# Patient Record
Sex: Male | Born: 1955
Health system: Southern US, Community
[De-identification: ages and names within clinical notes are randomized; demographics above are authoritative.]

## PROBLEM LIST (undated history)

## (undated) DIAGNOSIS — N200 Calculus of kidney: Secondary | ICD-10-CM

## (undated) DIAGNOSIS — K861 Other chronic pancreatitis: Secondary | ICD-10-CM

## (undated) DIAGNOSIS — E785 Hyperlipidemia, unspecified: Secondary | ICD-10-CM

## (undated) DIAGNOSIS — Z87442 Personal history of urinary calculi: Secondary | ICD-10-CM

## (undated) DIAGNOSIS — K219 Gastro-esophageal reflux disease without esophagitis: Secondary | ICD-10-CM

## (undated) DIAGNOSIS — F341 Dysthymic disorder: Secondary | ICD-10-CM

## (undated) DIAGNOSIS — I10 Essential (primary) hypertension: Secondary | ICD-10-CM

## (undated) HISTORY — DX: Calculus of kidney: N20.0

## (undated) HISTORY — DX: Gastro-esophageal reflux disease without esophagitis: K21.9

## (undated) HISTORY — PX: UPPER GASTROINTESTINAL ENDOSCOPY: SHX188

## (undated) HISTORY — DX: Essential (primary) hypertension: I10

## (undated) HISTORY — DX: Hyperlipidemia, unspecified: E78.5

## (undated) HISTORY — DX: Dysthymic disorder: F34.1

---

## 2001-04-04 ENCOUNTER — Encounter: Payer: Self-pay | Admitting: Family Medicine

## 2001-04-04 ENCOUNTER — Encounter: Admission: RE | Admit: 2001-04-04 | Discharge: 2001-04-04 | Payer: Self-pay | Admitting: Family Medicine

## 2001-04-17 ENCOUNTER — Encounter: Payer: Self-pay | Admitting: Family Medicine

## 2001-04-17 ENCOUNTER — Encounter: Admission: RE | Admit: 2001-04-17 | Discharge: 2001-04-17 | Payer: Self-pay | Admitting: Family Medicine

## 2001-04-17 HISTORY — PX: OTHER SURGICAL HISTORY: SHX169

## 2001-04-18 ENCOUNTER — Encounter: Payer: Self-pay | Admitting: Surgery

## 2001-04-18 ENCOUNTER — Encounter (INDEPENDENT_AMBULATORY_CARE_PROVIDER_SITE_OTHER): Payer: Self-pay | Admitting: Specialist

## 2001-04-18 ENCOUNTER — Observation Stay (HOSPITAL_COMMUNITY): Admission: RE | Admit: 2001-04-18 | Discharge: 2001-04-19 | Payer: Self-pay | Admitting: Surgery

## 2001-04-28 HISTORY — PX: CHOLECYSTECTOMY: SHX55

## 2002-03-13 ENCOUNTER — Encounter: Payer: Self-pay | Admitting: Family Medicine

## 2002-03-13 LAB — CONVERTED CEMR LAB: PSA: 0.2 ng/mL

## 2004-10-19 ENCOUNTER — Ambulatory Visit: Payer: Self-pay | Admitting: Family Medicine

## 2004-10-21 ENCOUNTER — Ambulatory Visit: Payer: Self-pay | Admitting: Family Medicine

## 2004-12-02 ENCOUNTER — Ambulatory Visit: Payer: Self-pay | Admitting: Family Medicine

## 2005-01-03 ENCOUNTER — Ambulatory Visit: Payer: Self-pay | Admitting: Family Medicine

## 2005-01-19 ENCOUNTER — Ambulatory Visit: Payer: Self-pay | Admitting: Family Medicine

## 2005-01-21 ENCOUNTER — Ambulatory Visit: Payer: Self-pay | Admitting: Family Medicine

## 2005-08-01 ENCOUNTER — Ambulatory Visit: Payer: Self-pay | Admitting: Family Medicine

## 2006-02-27 ENCOUNTER — Ambulatory Visit: Payer: Self-pay | Admitting: Family Medicine

## 2006-02-27 LAB — CONVERTED CEMR LAB: PSA: 0.17 ng/mL

## 2006-03-01 ENCOUNTER — Ambulatory Visit: Payer: Self-pay | Admitting: Family Medicine

## 2006-04-13 ENCOUNTER — Ambulatory Visit: Payer: Self-pay | Admitting: Family Medicine

## 2007-03-07 ENCOUNTER — Encounter: Payer: Self-pay | Admitting: Family Medicine

## 2007-03-07 DIAGNOSIS — K219 Gastro-esophageal reflux disease without esophagitis: Secondary | ICD-10-CM

## 2007-03-07 DIAGNOSIS — F341 Dysthymic disorder: Secondary | ICD-10-CM

## 2007-03-12 ENCOUNTER — Telehealth (INDEPENDENT_AMBULATORY_CARE_PROVIDER_SITE_OTHER): Payer: Self-pay | Admitting: *Deleted

## 2007-04-09 ENCOUNTER — Ambulatory Visit: Payer: Self-pay | Admitting: Family Medicine

## 2007-04-10 ENCOUNTER — Encounter: Payer: Self-pay | Admitting: Family Medicine

## 2007-04-11 ENCOUNTER — Ambulatory Visit: Payer: Self-pay | Admitting: Family Medicine

## 2007-04-11 LAB — CONVERTED CEMR LAB
Bilirubin Urine: NEGATIVE
Blood in Urine, dipstick: NEGATIVE
Glucose, Urine, Semiquant: NEGATIVE
Ketones, urine, test strip: NEGATIVE
Nitrite: NEGATIVE
Specific Gravity, Urine: 1.02
Urobilinogen, UA: 0.2
WBC Urine, dipstick: NEGATIVE
pH: 6

## 2007-04-25 ENCOUNTER — Ambulatory Visit: Payer: Self-pay | Admitting: Family Medicine

## 2007-05-01 ENCOUNTER — Encounter (INDEPENDENT_AMBULATORY_CARE_PROVIDER_SITE_OTHER): Payer: Self-pay | Admitting: *Deleted

## 2007-05-01 ENCOUNTER — Ambulatory Visit: Payer: Self-pay | Admitting: Family Medicine

## 2008-05-15 ENCOUNTER — Ambulatory Visit: Payer: Self-pay | Admitting: Family Medicine

## 2008-05-15 LAB — CONVERTED CEMR LAB
ALT: 26 units/L (ref 0–53)
AST: 21 units/L (ref 0–37)
Albumin: 3.9 g/dL (ref 3.5–5.2)
Alkaline Phosphatase: 65 units/L (ref 39–117)
BUN: 15 mg/dL (ref 6–23)
Basophils Absolute: 0 10*3/uL (ref 0.0–0.1)
Basophils Relative: 0.5 % (ref 0.0–3.0)
Bilirubin, Direct: 0.1 mg/dL (ref 0.0–0.3)
CO2: 30 meq/L (ref 19–32)
Calcium: 9.1 mg/dL (ref 8.4–10.5)
Chloride: 107 meq/L (ref 96–112)
Cholesterol: 167 mg/dL (ref 0–200)
Creatinine, Ser: 1 mg/dL (ref 0.4–1.5)
Direct LDL: 90.8 mg/dL
Eosinophils Absolute: 0.2 10*3/uL (ref 0.0–0.7)
Eosinophils Relative: 2.9 % (ref 0.0–5.0)
GFR calc Af Amer: 101 mL/min
GFR calc non Af Amer: 83 mL/min
Glucose, Bld: 79 mg/dL (ref 70–99)
HCT: 42.6 % (ref 39.0–52.0)
HDL: 36.1 mg/dL — ABNORMAL LOW (ref >39.0)
Hemoglobin: 14.8 g/dL (ref 13.0–17.0)
Lymphocytes Relative: 26.1 % (ref 12.0–46.0)
MCHC: 34.8 g/dL (ref 30.0–36.0)
MCV: 91.7 fL (ref 78.0–100.0)
Monocytes Absolute: 0.4 10*3/uL (ref 0.1–1.0)
Monocytes Relative: 6.8 % (ref 3.0–12.0)
Neutro Abs: 3.8 10*3/uL (ref 1.4–7.7)
Neutrophils Relative %: 63.7 % (ref 43.0–77.0)
PSA: 0.18 ng/mL (ref 0.10–4.00)
Platelets: 217 10*3/uL (ref 150–400)
Potassium: 4.3 meq/L (ref 3.5–5.1)
RBC: 4.64 M/uL (ref 4.22–5.81)
RDW: 12.3 % (ref 11.5–14.6)
Sodium: 141 meq/L (ref 135–145)
TSH: 2.03 microintl units/mL (ref 0.35–5.50)
Total Bilirubin: 0.8 mg/dL (ref 0.3–1.2)
Total CHOL/HDL Ratio: 4.6
Total Protein: 6.8 g/dL (ref 6.0–8.3)
Triglycerides: 276 mg/dL (ref 0–149)
VLDL: 55 mg/dL — ABNORMAL HIGH (ref 0–40)
WBC: 5.9 10*3/uL (ref 4.5–10.5)

## 2008-05-19 ENCOUNTER — Ambulatory Visit: Payer: Self-pay | Admitting: Family Medicine

## 2008-06-05 ENCOUNTER — Ambulatory Visit: Payer: Self-pay | Admitting: Family Medicine

## 2008-06-05 ENCOUNTER — Encounter (INDEPENDENT_AMBULATORY_CARE_PROVIDER_SITE_OTHER): Payer: Self-pay | Admitting: *Deleted

## 2008-06-05 LAB — CONVERTED CEMR LAB
OCCULT 1: NEGATIVE
OCCULT 2: NEGATIVE
OCCULT 3: NEGATIVE

## 2009-04-23 ENCOUNTER — Telehealth: Payer: Self-pay | Admitting: Family Medicine

## 2009-05-18 ENCOUNTER — Ambulatory Visit: Payer: Self-pay | Admitting: Family Medicine

## 2009-05-18 LAB — CONVERTED CEMR LAB
ALT: 27 units/L (ref 0–53)
AST: 24 units/L (ref 0–37)
Albumin: 4.1 g/dL (ref 3.5–5.2)
Alkaline Phosphatase: 72 units/L (ref 39–117)
BUN: 11 mg/dL (ref 6–23)
Bilirubin, Direct: 0 mg/dL (ref 0.0–0.3)
CO2: 30 meq/L (ref 19–32)
Calcium: 9.3 mg/dL (ref 8.4–10.5)
Chloride: 104 meq/L (ref 96–112)
Cholesterol: 175 mg/dL (ref 0–200)
Creatinine, Ser: 1.1 mg/dL (ref 0.4–1.5)
GFR calc non Af Amer: 74.27 mL/min (ref >60)
Glucose, Bld: 96 mg/dL (ref 70–99)
HDL: 39.8 mg/dL (ref >39.00)
LDL Cholesterol: 104 mg/dL — ABNORMAL HIGH (ref 0–99)
PSA: 0.2 ng/mL (ref 0.10–4.00)
Potassium: 4.2 meq/L (ref 3.5–5.1)
Sodium: 140 meq/L (ref 135–145)
TSH: 1.46 microintl units/mL (ref 0.35–5.50)
Total Bilirubin: 0.8 mg/dL (ref 0.3–1.2)
Total CHOL/HDL Ratio: 4
Total Protein: 7.1 g/dL (ref 6.0–8.3)
Triglycerides: 155 mg/dL — ABNORMAL HIGH (ref 0.0–149.0)
VLDL: 31 mg/dL (ref 0.0–40.0)

## 2009-05-20 ENCOUNTER — Ambulatory Visit: Payer: Self-pay | Admitting: Family Medicine

## 2009-06-24 ENCOUNTER — Ambulatory Visit: Payer: Self-pay | Admitting: Family Medicine

## 2009-06-24 LAB — CONVERTED CEMR LAB
OCCULT 1: NEGATIVE
OCCULT 2: NEGATIVE
OCCULT 3: NEGATIVE

## 2009-06-29 ENCOUNTER — Encounter (INDEPENDENT_AMBULATORY_CARE_PROVIDER_SITE_OTHER): Payer: Self-pay | Admitting: *Deleted

## 2010-01-14 ENCOUNTER — Encounter (INDEPENDENT_AMBULATORY_CARE_PROVIDER_SITE_OTHER): Payer: Self-pay | Admitting: *Deleted

## 2010-05-18 ENCOUNTER — Telehealth: Payer: Self-pay | Admitting: Family Medicine

## 2010-07-12 ENCOUNTER — Other Ambulatory Visit: Payer: Self-pay | Admitting: Family Medicine

## 2010-07-12 ENCOUNTER — Ambulatory Visit
Admission: RE | Admit: 2010-07-12 | Discharge: 2010-07-12 | Payer: Self-pay | Source: Home / Self Care | Attending: Family Medicine | Admitting: Family Medicine

## 2010-07-12 LAB — BASIC METABOLIC PANEL
BUN: 19 mg/dL (ref 6–23)
Calcium: 9 mg/dL (ref 8.4–10.5)
Chloride: 103 mEq/L (ref 96–112)
Creatinine, Ser: 1.1 mg/dL (ref 0.4–1.5)
GFR: 76.35 mL/min (ref >60.00)

## 2010-07-12 LAB — HEPATIC FUNCTION PANEL
ALT: 27 U/L (ref 0–53)
Bilirubin, Direct: 0.1 mg/dL (ref 0.0–0.3)
Total Bilirubin: 0.6 mg/dL (ref 0.3–1.2)

## 2010-07-12 LAB — CBC WITH DIFFERENTIAL/PLATELET
Eosinophils Relative: 4 % (ref 0.0–5.0)
MCV: 92.4 fl (ref 78.0–100.0)
Monocytes Absolute: 0.4 10*3/uL (ref 0.1–1.0)
Neutrophils Relative %: 63.5 % (ref 43.0–77.0)
Platelets: 203 10*3/uL (ref 150.0–400.0)
WBC: 6.4 10*3/uL (ref 4.5–10.5)

## 2010-07-12 LAB — LIPID PANEL
Cholesterol: 171 mg/dL (ref 0–200)
LDL Cholesterol: 97 mg/dL (ref 0–99)
Triglycerides: 160 mg/dL — ABNORMAL HIGH (ref 0.0–149.0)
VLDL: 32 mg/dL (ref 0.0–40.0)

## 2010-07-13 NOTE — Progress Notes (Signed)
Summary: needs written scripts for mail order  Phone Note Refill Request Call back at [phone] Message from:  Patient  Refills Requested: Medication #1:  SERTRALINE HCL 100 MG TABS one tab by mouth once daily  Medication #2:  PRILOSEC 20 MG  CPDR 1 by mouth daily Pt needs written 90 day scripts for mail order, please call when ready.  Initial call taken by: Lowella Petties CMA, AAMA,  May 18, 2010 10:25 AM  Follow-up for Phone Call        Advised pt that scripts are ready. Follow-up by: Lowella Petties CMA, AAMA,  May 19, 2010 8:44 AM    Prescriptions: SIMVASTATIN 40 MG  TABS (SIMVASTATIN) 1 daily  #90 x 3   Entered and Authorized by:   Shaune Leeks MD   Signed by:   Shaune Leeks MD on 05/19/2010   Method used:   Print then Give to Patient   RxID:   [id number] PRILOSEC 20 MG  CPDR (OMEPRAZOLE) 1 by mouth daily  #90 x 3   Entered and Authorized by:   Shaune Leeks MD   Signed by:   Shaune Leeks MD on 05/19/2010   Method used:   Print then Give to Patient   RxID:   [id number] SERTRALINE HCL 100 MG TABS (SERTRALINE HCL) one tab by mouth once daily  #90 x 3   Entered and Authorized by:   Shaune Leeks MD   Signed by:   Shaune Leeks MD on 05/19/2010   Method used:   Print then Give to Patient   RxID:   [id number]

## 2010-07-13 NOTE — Letter (Signed)
Summary: Results Follow up Letter  [place] at [facility]  [address] [place], [place] [address]   Phone: [phone]  Fax: [phone]    06/29/2009 MRN: [id number]  BRISTOL SOY [address] [place], [place]  [address]  Dear Mr. REEDE,  The following are the results of your recent test(s):  Test         Result    Pap Smear:        Normal _____  Not Normal _____ Comments: ______________________________________________________ Cholesterol: LDL(Bad cholesterol):         Your goal is less than:         HDL (Good cholesterol):       Your goal is more than: Comments:  ______________________________________________________ Mammogram:        Normal _____  Not Normal _____ Comments:  ___________________________________________________________________ Hemoccult:        Normal ___X__  Not normal _______ Comments:  Please repeat in one year.  _____________________________________________________________________ Other Tests:    We routinely do not discuss normal results over the telephone.  If you desire a copy of the results, or you have any questions about this information we can discuss them at your next office visit.   Sincerely,     Laurita Quint, MD

## 2010-07-13 NOTE — Letter (Signed)
Summary: Nadara Eaton letter  [place] at [facility]  [address] [place], [place] [address]   Phone: [phone]  Fax: [phone]       01/14/2010 MRN: [id number]  Dennis Lambert [address] [place], [place]  [address]  Dear Dennis Lambert,  [facility], and [facility] announce the retirement of Arta Silence, M.D., from full-time practice at the [facility] office effective December 10, 2009 and his plans of returning part-time.  It is important to Dr. Hetty Ely and to our practice that you understand that [facility] has seven physicians in our office for your health care needs.  We will continue to offer the same exceptional care that you have today.    Dr. Hetty Ely has spoken to many of you about his plans for retirement and returning part-time in the fall.   We will continue to work with you through the transition to schedule appointments for you in the office and meet the high standards that [facility] is committed to.   Again, it is with great pleasure that we share the news that Dr. Hetty Ely will return to [facility] in October of 2011 with a reduced schedule.    If you have any questions, or would like to request an appointment with one of our physicians, please call us at [phone] and press the option for Scheduling an appointment.  We take pleasure in providing you with excellent patient care and look forward to seeing you at your next office visit.  Our [facility] Physicians are:  Tillman Abide, M.D. Laurita Quint, M.D. Roxy Manns, M.D. Kerby Nora, M.D. Hannah Beat, M.D. Ruthe Mannan, M.D. We proudly welcomed Raechel Ache, M.D. and Eustaquio Boyden, M.D. to the practice in July/August 2011.  Sincerely,  [facility]

## 2010-07-15 ENCOUNTER — Encounter: Payer: Self-pay | Admitting: Family Medicine

## 2010-07-15 ENCOUNTER — Ambulatory Visit: Admit: 2010-07-15 | Payer: Self-pay | Admitting: Family Medicine

## 2010-07-15 ENCOUNTER — Encounter (INDEPENDENT_AMBULATORY_CARE_PROVIDER_SITE_OTHER): Payer: 59 | Admitting: Family Medicine

## 2010-07-15 DIAGNOSIS — Z0000 Encounter for general adult medical examination without abnormal findings: Secondary | ICD-10-CM

## 2010-07-21 NOTE — Letter (Signed)
Summary: [facility]: Immunoassay Fecal Occult Blood (iFOB) Order Form  [place] at [facility]  [address] [place], [place] [address]   Phone: [phone]  Fax: [phone]      [facility]: Immunoassay Fecal Occult Blood (iFOB) Order Form   July 15, 2010 MRN: [id number]   ANTHEM FRAZER Feb 02, 1956   Physicican Name:_____Dr Schaller____________________  Diagnosis Code:_____v 70.0_____________________      Shaune Leeks MD

## 2010-07-21 NOTE — Assessment & Plan Note (Signed)
Summary: CPX/ CLE   Vital Signs:  Patient profile:   55 year old male Height:      69.25 inches Weight:      230.50 pounds BMI:     33.92 Temp:     98.3 degrees F oral Pulse rate:   68 / minute Pulse rhythm:   regular BP sitting:   120 / 82  (left arm) Cuff size:   large  Vitals Entered By: Lewanda Rife LPN (July 15, 2010 2:59 PM) CC: CPX   History of Present Illness: Pt here for Comp Exam. He is feeling well. He has no complaints except his stomach is bothering him slightly. Thinks he ate somnething last night that did not agree.    Preventive Screening-Counseling & Management  Alcohol-Tobacco     Alcohol drinks/day: 0     Smoking Status: never     Passive Smoke Exposure: no  Caffeine-Diet-Exercise     Caffeine use/day: 3     Does Patient Exercise: yes     Type of exercise: walking on treadmill     Exercise (avg: min/session): 30-60     Times/week: 7  Problems Prior to Update: 1)  Special Screening Malig Neoplasms Other Sites  (ICD-V76.49) 2)  Neoplasm, Skin, Uncertain Behavior  (ICD-238.2) 3)  Health Maintenance Exam  (ICD-V70.0) 4)  Screening For Malignant Neoplasm, Prostate  (ICD-V76.44) 5)  Nevus,sebaceous Forehead, Watchful Waiting  (ICD-216.9) 6)  Anxiety Depression  (ICD-300.4) 7)  Hypercholesterolemia, 249/ldl 175.6/hdl 31.3  (ICD-272.0) 8)  Gerd  (ICD-530.81)  Medications Prior to Update: 1)  Sertraline Hcl 100 Mg Tabs (Sertraline Hcl) .... One Tab By Mouth Once Daily 2)  Aspirin 325 Mg  Tbec (Aspirin) .... Take 2 By Mouth Once A Day 3)  Multivitamins   Tabs (Multiple Vitamin) .... Take One By Mouth Once A Day 4)  Prilosec 20 Mg  Cpdr (Omeprazole) .Marland Kitchen.. 1 By Mouth Daily 5)  Simvastatin 40 Mg  Tabs (Simvastatin) .Marland Kitchen.. 1 Daily 6)  Bl Flax Seed Oil 1000 Mg  Caps (Flaxseed (Linseed)) .Marland Kitchen.. 1 Qd 7)  B Complex Vitamins   Caps (B Complex Vitamins) .Marland Kitchen.. 1 Qd 8)  Vitamin D3 2400 Unit/ml  Liqd (Cholecalciferol) .... Qd 9)  Bl Potassium 99 Mg  Tabs (Potassium)  .Marland Kitchen.. 1 Qd 10)  Omeprazole 40 Mg Cpdr (Omeprazole) 11)  Omeprazole 20 Mg Cpdr (Omeprazole)  Current Medications (verified): 1)  Sertraline Hcl 100 Mg Tabs (Sertraline Hcl) .... One Tab By Mouth Once Daily 2)  Aspirin 325 Mg  Tbec (Aspirin) .... Take 1 By Mouth Once A Day 3)  Multivitamins   Tabs (Multiple Vitamin) .... Take One By Mouth Once A Day 4)  Prilosec 20 Mg  Cpdr (Omeprazole) .Marland Kitchen.. 1 By Mouth Daily 5)  Simvastatin 40 Mg  Tabs (Simvastatin) .Marland Kitchen.. 1 Daily 6)  Bl Flax Seed Oil 1000 Mg  Caps (Flaxseed (Linseed)) .Marland Kitchen.. 1 Qd 7)  B Complex Vitamins   Caps (B Complex Vitamins) .Marland Kitchen.. 1 Qd 8)  Bl Potassium 99 Mg  Tabs (Potassium) .Marland Kitchen.. 1 Qd 9)  Vitamin D 1000 Unit  Tabs (Cholecalciferol) .... Take 1 Tablet By Mouth Once A Day  Allergies: 1)  ! Biaxin (Clarithromycin) 2)  ! Amoxicillin (Amoxicillin) 3)  ! Codeine Sulfate (Codeine Sulfate) 4)  ! Iodine  Past History:  Past Medical History: Last updated: 03/07/2007 GERD  Past Surgical History: Last updated: 03/07/2007 UGI- reflux  ~94 Hepatobiliary scan +, 04/17/01 Cholecystectomy, Daphine Deutscher) 04/28/01  Family History: Last updated: 07/15/2010  Father: A 70 Mother: Died at age 27 with HTN and renal disease with CHF Sister A 82  Fibromyalgia CV + Throughout both sides HBP + Throughout both sides Breast/Ovarian/Uterine Cancer - none Depression -  none ETOH/Drug abuse - none Strokes + Both sides, PGF deceased  Social History: Last updated: 07/15/2010 Marital Status: Married Children: One 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 started  with [employer]  Risk Factors: Alcohol Use: 0 (07/15/2010) Caffeine Use: 3 (07/15/2010) Exercise: yes (07/15/2010)  Risk Factors: Smoking Status: never (07/15/2010) Passive Smoke Exposure: no (07/15/2010)  Family History: Father: A 40 Mother: Died at age 64 with HTN and renal disease with CHF Sister A 51  Fibromyalgia CV + Throughout both sides HBP  + Throughout both sides Breast/Ovarian/Uterine Cancer - none Depression -  none ETOH/Drug abuse - none Strokes + Both sides, PGF deceased  Social History: Marital Status: Married Children: One 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 Eyes:  Denies blurring, discharge, and eye pain; presbyopia with glasses. ENT:  Denies decreased hearing, ear discharge, earache, and ringing in ears. CV:  Denies chest pain or discomfort, fainting, fatigue, palpitations, shortness of breath with exertion, swelling of feet, and swelling of hands. Resp:  Denies cough, shortness of breath, and wheezing. GI:  Denies abdominal pain, bloody stools, change in bowel habits, constipation, dark tarry stools, diarrhea, gas, hemorrhoids, indigestion, loss of appetite, nausea, vomiting, vomiting blood, and yellowish skin color. GU:  Complains of nocturia; denies discharge, dysuria, and urinary frequency; once or twice. MS:  Denies joint pain, low back pain, muscle aches, cramps, and stiffness. Derm:  Denies dryness, itching, and rash. Neuro:  Denies numbness, poor balance, tingling, and tremors.  Physical Exam  General:  Well-developed,well-nourished,in no acute distress; alert,appropriate and cooperative throughout examination, minimally  overweight. Head:  Normocephalic and atraumatic without obvious abnormalities. No apparent alopecia or balding. Sinuses NT. Eyes:  Conjunctiva clear bilaterally.  Ears:  External ear exam shows no significant lesions or deformities.  Otoscopic examination reveals clear canals, tympanic membranes are intact bilaterally without bulging, retraction, inflammation or discharge. Hearing is grossly normal bilaterally. Nose:  External nasal examination shows no deformity or inflammation. Nasal mucosa are pink  and moist without lesions or exudates. Mouth:  Oral mucosa and oropharynx without lesions or exudates.  Teeth in good repair. Neck:  No deformities, masses, or tenderness noted. Chest Wall:  No deformities, masses, tenderness or gynecomastia noted. Breasts:  No masses or gynecomastia noted Lungs:  Normal respiratory effort, chest expands symmetrically. Lungs are clear to auscultation, no crackles or wheezes. Heart:  Normal rate and regular rhythm. S1 and S2 normal without gallop, murmur, click, rub or other extra sounds. Abdomen:  Bowel sounds positive,abdomen soft and non-tender without masses, organomegaly or hernias noted. Mildly protuberant. Rectal:  No external abnormalities noted. Normal sphincter tone. No rectal masses or tenderness. G neg. Genitalia:  Testes bilaterally descended without nodularity, tenderness or masses. No scrotal masses or lesions. No penis lesions or urethral discharge. Prostate:  Prostate gland firm and smooth, no enlargement, nodularity, tenderness, mass, asymmetry or induration. 10-20 gms. Msk:  No deformity or scoliosis noted of thoracic or lumbar spine.   Pulses:  R and L carotid,radial,femoral,dorsalis pedis and posterior tibial pulses  are full and equal bilaterally Extremities:  No clubbing, cyanosis, edema, or deformity noted with normal full range of motion of all joints.   Neurologic:  No cranial nerve deficits noted. Station and gait are normal. Sensory, motor and coordinative functions appear intact. Skin:  Intact without suspicious lesions or rashes Cervical Nodes:  No lymphadenopathy noted Inguinal Nodes:  No significant adenopathy Psych:  Cognition and judgment appear intact. Alert and cooperative with normal attention span and concentration. No apparent delusions, illusions, hallucinations   Impression & Recommendations:  Problem # 1:  HEALTH MAINTENANCE EXAM (ICD-V70.0) Assessment Comment Only  Problem # 2:  SCREENING FOR MALIGNANT NEOPLASM,  PROSTATE (ICD-V76.44) Assessment: Unchanged Stable exam and PSA  Problem # 3:  NEVUS,SEBACEOUS  FOREHEAD, WATCHFUL WAITING (ICD-216.9) Assessment: Unchanged  Problem # 4:  ANXIETY DEPRESSION (ICD-300.4) Assessment: Unchanged Stable, can tell if he doesn't take his med.  Problem # 5:  HYPERCHOLESTEROLEMIA, 249/LDL 175.6/HDL 31.3 (ICD-272.0) Assessment: Unchanged Good results. Cont as is. His updated medication list for this problem includes:    Simvastatin 40 Mg Tabs (Simvastatin) .Marland Kitchen... 1 daily  Labs Reviewed: SGOT: 26 (07/12/2010)   SGPT: 27 (07/12/2010)   HDL:41.90 (07/12/2010), 39.80 (05/18/2009)  LDL:97 (07/12/2010), 104 (16/03/9603)  Chol:171 (07/12/2010), 175 (05/18/2009)  Trig:160.0 (07/12/2010), 155.0 (05/18/2009)  Problem # 6:  GERD (ICD-530.81) Assessment: Unchanged Doing well. The following medications were removed from the medication list:    Omeprazole 40 Mg Cpdr (Omeprazole)    Omeprazole 20 Mg Cpdr (Omeprazole) His updated medication list for this problem includes:    Prilosec 20 Mg Cpdr (Omeprazole) .Marland Kitchen... 1 by mouth daily  Diagnostics Reviewed:  Discussed lifestyle modifications, diet, antacids/medications, and preventive measures. Handout provided.   Complete Medication List: 1)  Sertraline Hcl 100 Mg Tabs (Sertraline hcl) .... One tab by mouth once daily 2)  Aspirin 325 Mg Tbec (Aspirin) .... Take 1 by mouth once a day 3)  Multivitamins Tabs (Multiple vitamin) .... Take one by mouth once a day 4)  Prilosec 20 Mg Cpdr (Omeprazole) .Marland Kitchen.. 1 by mouth daily 5)  Simvastatin 40 Mg Tabs (Simvastatin) .Marland Kitchen.. 1 daily 6)  Bl Flax Seed Oil 1000 Mg Caps (Flaxseed (linseed)) .Marland Kitchen.. 1 qd 7)  B Complex Vitamins Caps (B complex vitamins) .Marland Kitchen.. 1 qd 8)  Bl Potassium 99 Mg Tabs (Potassium) .Marland Kitchen.. 1 qd 9)  Vitamin D 1000 Unit Tabs (Cholecalciferol) .... Take 1 tablet by mouth once a day  Patient Instructions: 1)  RTC one year, sooner as needed.    Orders Added: 1)  Est. Patient  40-64 years [99396]    Current Allergies (reviewed today): ! BIAXIN (CLARITHROMYCIN) ! AMOXICILLIN (AMOXICILLIN) ! CODEINE SULFATE (CODEINE SULFATE) ! IODINE

## 2010-07-29 ENCOUNTER — Encounter (INDEPENDENT_AMBULATORY_CARE_PROVIDER_SITE_OTHER): Payer: Self-pay | Admitting: *Deleted

## 2010-07-29 ENCOUNTER — Other Ambulatory Visit: Payer: Self-pay | Admitting: Family Medicine

## 2010-07-29 ENCOUNTER — Other Ambulatory Visit: Payer: 59

## 2010-07-29 DIAGNOSIS — Z0000 Encounter for general adult medical examination without abnormal findings: Secondary | ICD-10-CM

## 2010-08-04 NOTE — Letter (Signed)
Summary: Results Follow up Letter  [place] at [facility]  [address] [place], [place] [address]   Phone: [phone]  Fax: [phone]    07/29/2010 MRN: [id number]    Dennis Lambert [address] [place], [place]  [address]  [place]    Dear Mr. [name],  The following are the results of your recent test(s):  Test         Result    Pap Smear:        Normal _____  Not Normal _____ Comments: ______________________________________________________ Cholesterol: LDL(Bad cholesterol):         Your goal is less than:         HDL (Good cholesterol):       Your goal is more than: Comments:  ______________________________________________________ Mammogram:        Normal _____  Not Normal _____ Comments:  ___________________________________________________________________ Hemoccult:        Normal __X___  Not normal _______ Comments:  Please repeat in one year.  _____________________________________________________________________ Other Tests:    We routinely do not discuss normal results over the telephone.  If you desire a copy of the results, or you have any questions about this information we can discuss them at your next office visit.   Sincerely,    Laurita Quint, MD

## 2010-10-29 NOTE — Op Note (Signed)
[facility]  Patient:    Dennis Lambert, Dennis Lambert Visit Number: [id number] MRN: [id number]          Service Type: SUR Location: 4W [id number] [id number] Attending Physician:  Katha Cabal Dictated by:   Thornton Park Daphine Deutscher, M.D. Proc. Date: 04/18/01 Admit Date:  04/18/2001 Discharge Date: 04/19/2001   CC:         Laurita Quint, M.D. [facility]   Operative Report  PREOPERATIVE DIAGNOSIS:  Impacted stone in distal cystic duct with cholecystitis.  POSTOPERATIVE DIAGNOSIS:  Impacted stone in distal cystic duct with cholecystitis.  PROCEDURE:  Laparoscopic cholecystectomy with intraoperative cholangiogram.  SURGEON:  Thornton Park. Daphine Deutscher, M.D.  ASSISTANT:  Sharlet Salina T. Hoxworth, M.D.  ANESTHESIA:  General endotracheal.  DESCRIPTION OF PROCEDURE:  The patient was taken to room #1 on April 18, 2001, and given general anesthesia.  The abdomen was prepped with Betadine and draped sterilely.  I made a longitudinal incision down into the umbilicus, and opened into the abdomen.  I did not encounter an umbilical hernia in going in. I had not felt one preoperative and did not see one there at the base of his umbilicus.  Through a pursestring suture, I inserted a Hasson cannula, insufflated, and three trocars were placed in the upper abdomen.  It was impressive that his omentum had walled off his gallbladder and was up on top of the liver.  This was brought down revealing a fairly prominent engorged gallbladder.  This was grasped at the tip and sharp dissection was used to take down numerous adhesions to the fundus and subsequently the infundibulum. I had a lot of inflammatory changes down in the neck consistent with acute cholecystitis as well as previously mentioned chronic changes.  I got around the distal cystic duct, put a clip upon it, incised it, and did not get any stony material coming back, but inserted the Reddick catheter, and took a dynamic cholangiogram which  showed intrahepatic filling and free flow into the duodenum.  The cystic duct was triple clipped, divided.  The cystic artery was triple clipped, divided, and the gallbladder was removed from the bed with the hook coagulator without difficulty.  It was separated and put in a bag, and brought out through the umbilical port.  The umbilical port was then tied down.  The gallbladder bed was inspected again and irrigated, and no bleeding or bile leaks were noted.  The port sites were all injected with 0.5% Marcaine, and the abdomen was deflated, and the skin was closed with 4-0 Vicryl, Benzoin, and Steri-Strips.  The patient seemed to tolerate the procedure well and taken to the recovery room in satisfactory condition. Dictated by:   Thornton Park Daphine Deutscher, M.D. Attending Physician:  Katha Cabal DD:  04/18/01 TD:  04/20/01 Job: [id number]

## 2011-04-14 ENCOUNTER — Other Ambulatory Visit: Payer: Self-pay | Admitting: Family Medicine

## 2011-04-14 NOTE — Telephone Encounter (Signed)
Received refills electronically from pharmacy. Is it okay to refill medication?

## 2011-07-28 ENCOUNTER — Other Ambulatory Visit: Payer: 59

## 2011-09-21 ENCOUNTER — Other Ambulatory Visit: Payer: Self-pay | Admitting: Family Medicine

## 2011-09-21 DIAGNOSIS — Z125 Encounter for screening for malignant neoplasm of prostate: Secondary | ICD-10-CM

## 2011-09-21 DIAGNOSIS — E7800 Pure hypercholesterolemia, unspecified: Secondary | ICD-10-CM

## 2011-09-26 ENCOUNTER — Other Ambulatory Visit (INDEPENDENT_AMBULATORY_CARE_PROVIDER_SITE_OTHER): Payer: 59

## 2011-09-26 DIAGNOSIS — E7800 Pure hypercholesterolemia, unspecified: Secondary | ICD-10-CM

## 2011-09-26 DIAGNOSIS — Z125 Encounter for screening for malignant neoplasm of prostate: Secondary | ICD-10-CM

## 2011-09-26 LAB — COMPREHENSIVE METABOLIC PANEL
ALT: 24 U/L (ref 0–53)
AST: 23 U/L (ref 0–37)
CO2: 28 mEq/L (ref 19–32)
GFR: 74.4 mL/min (ref >60.00)
Sodium: 139 mEq/L (ref 135–145)
Total Bilirubin: 0.3 mg/dL (ref 0.3–1.2)
Total Protein: 6.7 g/dL (ref 6.0–8.3)

## 2011-09-26 LAB — LIPID PANEL
Cholesterol: 178 mg/dL (ref 0–200)
LDL Cholesterol: 100 mg/dL — ABNORMAL HIGH (ref 0–99)
Total CHOL/HDL Ratio: 4
Triglycerides: 180 mg/dL — ABNORMAL HIGH (ref 0.0–149.0)

## 2011-09-26 LAB — PSA: PSA: 0.16 ng/mL (ref 0.10–4.00)

## 2011-10-10 ENCOUNTER — Encounter: Payer: Self-pay | Admitting: Family Medicine

## 2011-10-11 ENCOUNTER — Ambulatory Visit (INDEPENDENT_AMBULATORY_CARE_PROVIDER_SITE_OTHER): Payer: 59 | Admitting: Family Medicine

## 2011-10-11 ENCOUNTER — Encounter: Payer: Self-pay | Admitting: Family Medicine

## 2011-10-11 VITALS — BP 130/76 | HR 75 | Temp 98.3°F | Wt 239.8 lb

## 2011-10-11 DIAGNOSIS — F341 Dysthymic disorder: Secondary | ICD-10-CM

## 2011-10-11 DIAGNOSIS — Z1211 Encounter for screening for malignant neoplasm of colon: Secondary | ICD-10-CM

## 2011-10-11 DIAGNOSIS — Z0000 Encounter for general adult medical examination without abnormal findings: Secondary | ICD-10-CM | POA: Insufficient documentation

## 2011-10-11 DIAGNOSIS — E7800 Pure hypercholesterolemia, unspecified: Secondary | ICD-10-CM

## 2011-10-11 DIAGNOSIS — K219 Gastro-esophageal reflux disease without esophagitis: Secondary | ICD-10-CM

## 2011-10-11 MED ORDER — SIMVASTATIN 40 MG PO TABS: 40.0000 mg | ORAL_TABLET | Freq: Every day | ORAL | Status: DC

## 2011-10-11 MED ORDER — OMEPRAZOLE 20 MG PO CPDR: 20.0000 mg | DELAYED_RELEASE_CAPSULE | Freq: Every day | ORAL | Status: DC

## 2011-10-11 MED ORDER — SERTRALINE HCL 100 MG PO TABS: 100.0000 mg | ORAL_TABLET | Freq: Every day | ORAL | Status: DC

## 2011-10-11 NOTE — Assessment & Plan Note (Signed)
Routine anticipatory guidance given to patient.  See health maintenance. Td 2003 Flu shot declined and discussed, encouraged.  PSA okay.   D/w patient [id number] for colon cancer screening, including IFOB vs. colonoscopy.  Risks and benefits of both were discussed and patient voiced understanding.  Pt elects for: IFOB.   Exercise- minimally organized.  Discussed, esp as related to mild inc in sugar.

## 2011-10-11 NOTE — Patient Instructions (Addendum)
Go to the lab on the way out.   Check on the amoxil allergy (or penicillin allergy).   I would get a flu shot each fall.   I would keep working on your diet and try to get some regular exercise.

## 2011-10-11 NOTE — Assessment & Plan Note (Signed)
Controlled except for inc in TG, continue current meds.  D/w pt about weight loss.

## 2011-10-11 NOTE — Progress Notes (Signed)
CPE- See plan.  Routine anticipatory guidance given to patient.  See health maintenance. Td 2003 Flu shot declined and discussed, encouraged.  PSA okay.   D/w patient [id number] for colon cancer screening, including IFOB vs. colonoscopy.  Risks and benefits of both were discussed and patient voiced understanding.  Pt elects for: IFOB.   Exercise- minimally organized.  Discussed.   GERD s/p upper GI and doing well.  No ADE from med.   Dysthymic disorder, mood swings controlled with current meds. No ADE.   Elevated Cholesterol: Using medications without problems:yes Muscle aches: no Diet compliance:yes Exercise:minimal organized activity, discussed.  TG and sugar minimally up.    PMH and SH reviewed  Meds, vitals, and allergies reviewed.   ROS: See HPI.  Otherwise negative.    GEN: nad, alert and oriented HEENT: mucous membranes moist NECK: supple w/o LA CV: rrr. PULM: ctab, no inc wob ABD: soft, +bs EXT: no edema SKIN: no acute rash but sebaceous lesion on forehead isn't changed, 2x3 cm, likely mild actinic changes on forehead.   Prostate gland firm and smooth, no enlargement, nodularity, tenderness, mass, asymmetry or induration.

## 2011-10-11 NOTE — Assessment & Plan Note (Signed)
Controlled, continue current meds.  D/w pt about weight loss.

## 2011-10-11 NOTE — Assessment & Plan Note (Signed)
Controlled, continue current meds.   

## 2011-10-19 ENCOUNTER — Encounter: Payer: Self-pay | Admitting: *Deleted

## 2011-10-19 ENCOUNTER — Other Ambulatory Visit: Payer: 59

## 2011-10-19 DIAGNOSIS — Z1211 Encounter for screening for malignant neoplasm of colon: Secondary | ICD-10-CM

## 2011-10-19 DIAGNOSIS — Z125 Encounter for screening for malignant neoplasm of prostate: Secondary | ICD-10-CM

## 2012-03-29 ENCOUNTER — Other Ambulatory Visit: Payer: Self-pay | Admitting: *Deleted

## 2012-03-30 MED ORDER — SERTRALINE HCL 100 MG PO TABS: 100.0000 mg | ORAL_TABLET | Freq: Every day | ORAL | Status: DC

## 2012-03-30 NOTE — Telephone Encounter (Signed)
Sent!

## 2012-08-10 ENCOUNTER — Other Ambulatory Visit: Payer: Self-pay

## 2012-08-10 MED ORDER — SIMVASTATIN 40 MG PO TABS: 40.0000 mg | ORAL_TABLET | Freq: Every day | ORAL | Status: DC

## 2012-08-10 MED ORDER — OMEPRAZOLE 20 MG PO CPDR: 20.0000 mg | DELAYED_RELEASE_CAPSULE | Freq: Every day | ORAL | Status: DC

## 2012-08-10 NOTE — Telephone Encounter (Signed)
Pt request refill # [facility] sim[facility]atin and omeprazole; pt notified done & scheduled CPX 10/12/12.

## 2012-09-27 ENCOUNTER — Other Ambulatory Visit: Payer: Self-pay | Admitting: Family Medicine

## 2012-09-27 DIAGNOSIS — E7800 Pure hypercholesterolemia, unspecified: Secondary | ICD-10-CM

## 2012-10-05 ENCOUNTER — Other Ambulatory Visit (INDEPENDENT_AMBULATORY_CARE_PROVIDER_SITE_OTHER): Payer: 59

## 2012-10-05 DIAGNOSIS — E7800 Pure hypercholesterolemia, unspecified: Secondary | ICD-10-CM

## 2012-10-06 LAB — COMPREHENSIVE METABOLIC PANEL
ALT: 25 IU/L (ref 0–44)
AST: 22 IU/L (ref 0–40)
Alkaline Phosphatase: 86 IU/L (ref 39–117)
BUN/Creatinine Ratio: 17 (ref 9–20)
CO2: 24 mmol/L (ref 19–28)
Calcium: 9.3 mg/dL (ref 8.7–10.2)
Chloride: 103 mmol/L (ref 97–108)
Creatinine, Ser: 0.95 mg/dL (ref 0.76–1.27)
Potassium: 4.6 mmol/L (ref 3.5–5.2)
Sodium: 144 mmol/L (ref 134–144)

## 2012-10-06 LAB — LIPID PANEL
Chol/HDL Ratio: 3.8 ratio units (ref 0.0–5.0)
HDL: 48 mg/dL (ref >39)
LDL Calculated: 99 mg/dL (ref 0–99)
Triglycerides: 185 mg/dL — ABNORMAL HIGH (ref 0–149)
VLDL Cholesterol Cal: 37 mg/dL (ref 5–40)

## 2012-10-12 ENCOUNTER — Ambulatory Visit (INDEPENDENT_AMBULATORY_CARE_PROVIDER_SITE_OTHER): Payer: 59 | Admitting: Family Medicine

## 2012-10-12 ENCOUNTER — Encounter: Payer: Self-pay | Admitting: Family Medicine

## 2012-10-12 VITALS — BP 116/74 | HR 67 | Temp 98.1°F | Ht 70.0 in | Wt 227.0 lb

## 2012-10-12 DIAGNOSIS — Z0000 Encounter for general adult medical examination without abnormal findings: Secondary | ICD-10-CM

## 2012-10-12 DIAGNOSIS — Z1211 Encounter for screening for malignant neoplasm of colon: Secondary | ICD-10-CM

## 2012-10-12 DIAGNOSIS — Z23 Encounter for immunization: Secondary | ICD-10-CM

## 2012-10-12 DIAGNOSIS — F341 Dysthymic disorder: Secondary | ICD-10-CM

## 2012-10-12 DIAGNOSIS — K219 Gastro-esophageal reflux disease without esophagitis: Secondary | ICD-10-CM

## 2012-10-12 DIAGNOSIS — E7800 Pure hypercholesterolemia, unspecified: Secondary | ICD-10-CM

## 2012-10-12 MED ORDER — OMEPRAZOLE 20 MG PO CPDR: 20.0000 mg | DELAYED_RELEASE_CAPSULE | Freq: Every day | ORAL | Status: DC

## 2012-10-12 MED ORDER — SIMVASTATIN 40 MG PO TABS: 40.0000 mg | ORAL_TABLET | Freq: Every day | ORAL | Status: DC

## 2012-10-12 MED ORDER — SERTRALINE HCL 100 MG PO TABS: 100.0000 mg | ORAL_TABLET | Freq: Every day | ORAL | Status: DC

## 2012-10-12 NOTE — Assessment & Plan Note (Signed)
Controlled, continue current meds.   

## 2012-10-12 NOTE — Patient Instructions (Addendum)
Go to the lab on the way out.  We'll contact you with your lab report. Take care.   

## 2012-10-12 NOTE — Assessment & Plan Note (Signed)
Reasonable control, continue current meds.  Continue work on diet and exercise for TGs.  Labs d/w pt.

## 2012-10-12 NOTE — Progress Notes (Signed)
CPE- See plan.  Routine anticipatory guidance given to patient.  See health maintenance. Tetanus 2014 Flu shot encouraged D/w patient [id number] for colon cancer screening, including IFOB vs. colonoscopy.  Risks and benefits of both were discussed and patient voiced understanding.  Pt elects for: IFOB.   Prostate cancer screening and PSA options (with potential risks and benefits of testing vs not testing) were discussed along with recent recs/guidelines.  He declined testing PSA at this point.  Diet and exercise d/w pt.  He doing better diet than with exercise.  Encouraged.  Living will.  Would have his wife designated if incapacitate.   Currently unemployed and looking for work. He has had interviews recently.  He has more interviews pending.   Mood is stable in spite of recent upheaval.  Compliant with meds.  sleeping well.   Elevated Cholesterol: Using medications without problems:yes Muscle aches: no Diet compliance:yes Exercise: as above  GERD.  Controlled with current meds. No ADE.  When he has skipped a day, he had quick return of symptoms.    PMH and SH reviewed  Meds, vitals, and allergies reviewed.   ROS: See HPI.  Otherwise negative.    GEN: nad, alert and oriented HEENT: mucous membranes moist NECK: supple w/o LA CV: rrr. PULM: ctab, no inc wob ABD: soft, +bs EXT: no edema SKIN: no acute rash

## 2012-10-12 NOTE — Assessment & Plan Note (Signed)
Routine anticipatory guidance given to patient.  See health maintenance. Tetanus 2014 Flu shot encouraged D/w patient ZO:XWRUEAV for colon cancer screening, including IFOB vs. colonoscopy.  Risks and benefits of both were discussed and patient voiced understanding.  Pt elects for: IFOB.   Prostate cancer screening and PSA options (with potential risks and benefits of testing vs not testing) were discussed along with recent recs/guidelines.  He declined testing PSA at this point.  Diet and exercise d/w pt.  He doing better diet than with exercise.  Encouraged.  Living will.  Would have his wife designated if incapacitate.   Currently unemployed and looking for work. He has had interviews recently.  He has more interviews pending.

## 2012-10-18 ENCOUNTER — Other Ambulatory Visit (INDEPENDENT_AMBULATORY_CARE_PROVIDER_SITE_OTHER): Payer: 59

## 2012-10-18 ENCOUNTER — Encounter: Payer: Self-pay | Admitting: *Deleted

## 2012-10-18 DIAGNOSIS — Z1211 Encounter for screening for malignant neoplasm of colon: Secondary | ICD-10-CM

## 2012-11-15 ENCOUNTER — Other Ambulatory Visit: Payer: Self-pay | Admitting: Family Medicine

## 2012-11-26 ENCOUNTER — Other Ambulatory Visit: Payer: Self-pay

## 2012-11-26 MED ORDER — SIMVASTATIN 40 MG PO TABS: 40.0000 mg | ORAL_TABLET | Freq: Every day | ORAL | Status: DC

## 2012-11-26 NOTE — Telephone Encounter (Signed)
Pt request refill simvastatin [facility]; [facility] said did not receive 10/12/12 refill.; advised pt resent.

## 2013-07-16 ENCOUNTER — Other Ambulatory Visit: Payer: Self-pay | Admitting: Family Medicine

## 2013-07-16 NOTE — Telephone Encounter (Signed)
Last prescribed 10/2012--please advise 

## 2013-07-17 NOTE — Telephone Encounter (Signed)
Sent, schedule CPE for ~11/2013.  Thanks.

## 2013-07-17 NOTE — Telephone Encounter (Signed)
Patient notified as instructed by telephone. Appointment scheduled. 

## 2013-11-07 ENCOUNTER — Other Ambulatory Visit: Payer: Self-pay | Admitting: Family Medicine

## 2013-11-07 DIAGNOSIS — E7800 Pure hypercholesterolemia, unspecified: Secondary | ICD-10-CM

## 2013-11-11 ENCOUNTER — Other Ambulatory Visit (INDEPENDENT_AMBULATORY_CARE_PROVIDER_SITE_OTHER): Payer: 59

## 2013-11-11 DIAGNOSIS — E7800 Pure hypercholesterolemia, unspecified: Secondary | ICD-10-CM

## 2013-11-11 NOTE — Addendum Note (Signed)
Addended by: Baldomero Lamy on: 11/11/2013 08:13 AM   Modules accepted: Orders

## 2013-11-12 LAB — LIPID PANEL
CHOL/HDL RATIO: 5.9 ratio — AB (ref 0.0–5.0)
Cholesterol, Total: 240 mg/dL — ABNORMAL HIGH (ref 100–199)
HDL: 41 mg/dL (ref >39)
LDL CALC: 155 mg/dL — AB (ref 0–99)
Triglycerides: 222 mg/dL — ABNORMAL HIGH (ref 0–149)
VLDL CHOLESTEROL CAL: 44 mg/dL — AB (ref 5–40)

## 2013-11-12 LAB — COMPREHENSIVE METABOLIC PANEL
A/G RATIO: 1.7 (ref 1.1–2.5)
ALT: 22 IU/L (ref 0–44)
AST: 22 IU/L (ref 0–40)
Albumin: 4.2 g/dL (ref 3.5–5.5)
Alkaline Phosphatase: 82 IU/L (ref 39–117)
BILIRUBIN TOTAL: 0.3 mg/dL (ref 0.0–1.2)
BUN/Creatinine Ratio: 10 (ref 9–20)
BUN: 11 mg/dL (ref 6–24)
CO2: 21 mmol/L (ref 18–29)
Calcium: 9.2 mg/dL (ref 8.7–10.2)
Chloride: 102 mmol/L (ref 97–108)
Creatinine, Ser: 1.14 mg/dL (ref 0.76–1.27)
GFR, EST AFRICAN AMERICAN: 81 mL/min/{1.73_m2} (ref >59)
GFR, EST NON AFRICAN AMERICAN: 70 mL/min/{1.73_m2} (ref >59)
Globulin, Total: 2.5 g/dL (ref 1.5–4.5)
Glucose: 104 mg/dL — ABNORMAL HIGH (ref 65–99)
Potassium: 3.9 mmol/L (ref 3.5–5.2)
SODIUM: 139 mmol/L (ref 134–144)
TOTAL PROTEIN: 6.7 g/dL (ref 6.0–8.5)

## 2013-11-15 ENCOUNTER — Encounter: Payer: Self-pay | Admitting: Family Medicine

## 2013-11-15 ENCOUNTER — Ambulatory Visit (INDEPENDENT_AMBULATORY_CARE_PROVIDER_SITE_OTHER): Payer: 59 | Admitting: Family Medicine

## 2013-11-15 VITALS — BP 118/74 | HR 66 | Temp 97.7°F | Ht 70.0 in | Wt 226.5 lb

## 2013-11-15 DIAGNOSIS — Z0000 Encounter for general adult medical examination without abnormal findings: Secondary | ICD-10-CM

## 2013-11-15 DIAGNOSIS — F341 Dysthymic disorder: Secondary | ICD-10-CM

## 2013-11-15 DIAGNOSIS — E7800 Pure hypercholesterolemia, unspecified: Secondary | ICD-10-CM

## 2013-11-15 DIAGNOSIS — Z1211 Encounter for screening for malignant neoplasm of colon: Secondary | ICD-10-CM

## 2013-11-15 MED ORDER — OMEPRAZOLE 20 MG PO CPDR: DELAYED_RELEASE_CAPSULE | ORAL | Status: DC

## 2013-11-15 MED ORDER — SERTRALINE HCL 100 MG PO TABS: ORAL_TABLET | ORAL | Status: DC

## 2013-11-15 MED ORDER — SIMVASTATIN 40 MG PO TABS: 40.0000 mg | ORAL_TABLET | Freq: Every day | ORAL | Status: DC

## 2013-11-15 NOTE — Progress Notes (Signed)
Pre visit review using our clinic review tool, if applicable. No additional management support is needed unless otherwise documented below in the visit note.  CPE- See plan.  Routine anticipatory guidance given to patient.  See health maintenance. Tetanus 2014 Shingles and PNA not due.   Flu shot encouraged.  D/w pt.  D/w patient [id number] for colon cancer screening, including IFOB vs. colonoscopy.  Risks and benefits of both were discussed and patient voiced understanding.  Pt elects HBZ:JIRC.   Prostate cancer screening and PSA options (with potential risks and benefits of testing vs not testing) were discussed along with recent recs/guidelines.  He declined testing PSA at this point. Living will d/w pt.  Wife designated if patient were incapacitated.   Diet and exercise d/w pt.  Diet is good.  Exercise- limited due to knees, but he's doing as much as can.  Mainly yard work.   Elevated Cholesterol: Using medications without problems: off meds- his statin was denied and I'll check on that.  Muscle aches: no Diet compliance:yes Exercise:yes  Mood swings controlled with SSRI w/o ADE. Wants to continue.    PMH and SH reviewed  Meds, vitals, and allergies reviewed.   ROS: See HPI.  Otherwise negative.    GEN: nad, alert and oriented HEENT: mucous membranes moist NECK: supple w/o LA CV: rrr. PULM: ctab, no inc wob ABD: soft, +bs EXT: no edema SKIN: no acute rash

## 2013-11-15 NOTE — Patient Instructions (Addendum)
Go to the lab on the way out.  We'll contact you with your lab report. Take care.  Glad to see you.  

## 2013-11-17 NOTE — Assessment & Plan Note (Signed)
Controlled with current meds.  Wants to continue.  Doing well.

## 2013-11-17 NOTE — Assessment & Plan Note (Signed)
Restart statin.  Labs d/w pt.

## 2013-11-17 NOTE — Assessment & Plan Note (Signed)
Routine anticipatory guidance given to patient.  See health maintenance. Tetanus 2014 Shingles and PNA not due.   Flu shot encouraged.  D/w pt.  D/w patient [id number] for colon cancer screening, including IFOB vs. colonoscopy.  Risks and benefits of both were discussed and patient voiced understanding.  Pt elects RSW:NIOE.   Prostate cancer screening and PSA options (with potential risks and benefits of testing vs not testing) were discussed along with recent recs/guidelines.  He declined testing PSA at this point. Living will d/w pt.  Wife designated if patient were incapacitated.   Diet and exercise d/w pt.  Diet is good.  Exercise- limited due to knees, but he's doing as much as can.  Mainly yard work.

## 2014-05-19 ENCOUNTER — Other Ambulatory Visit (INDEPENDENT_AMBULATORY_CARE_PROVIDER_SITE_OTHER): Payer: 59

## 2014-05-19 ENCOUNTER — Other Ambulatory Visit: Payer: 59

## 2014-05-19 DIAGNOSIS — Z1211 Encounter for screening for malignant neoplasm of colon: Secondary | ICD-10-CM

## 2014-05-19 LAB — FECAL OCCULT BLOOD, IMMUNOCHEMICAL: Fecal Occult Bld: NEGATIVE

## 2014-05-20 ENCOUNTER — Encounter: Payer: Self-pay | Admitting: *Deleted

## 2014-09-13 ENCOUNTER — Other Ambulatory Visit: Payer: Self-pay | Admitting: Family Medicine

## 2014-11-14 ENCOUNTER — Other Ambulatory Visit: Payer: Self-pay | Admitting: Family Medicine

## 2014-12-12 ENCOUNTER — Other Ambulatory Visit: Payer: Self-pay | Admitting: Family Medicine

## 2015-01-11 ENCOUNTER — Other Ambulatory Visit: Payer: Self-pay | Admitting: Family Medicine

## 2015-01-11 DIAGNOSIS — E7800 Pure hypercholesterolemia, unspecified: Secondary | ICD-10-CM

## 2015-01-16 ENCOUNTER — Other Ambulatory Visit (INDEPENDENT_AMBULATORY_CARE_PROVIDER_SITE_OTHER): Payer: 59

## 2015-01-16 DIAGNOSIS — E7800 Pure hypercholesterolemia, unspecified: Secondary | ICD-10-CM

## 2015-01-16 DIAGNOSIS — Z0000 Encounter for general adult medical examination without abnormal findings: Secondary | ICD-10-CM

## 2015-01-16 NOTE — Addendum Note (Signed)
Addended by: Alvina Chou on: 01/16/2015 08:56 AM   Modules accepted: Orders

## 2015-01-17 LAB — COMPREHENSIVE METABOLIC PANEL
ALBUMIN: 4.2 g/dL (ref 3.5–5.5)
ALK PHOS: 85 IU/L (ref 39–117)
ALT: 19 IU/L (ref 0–44)
AST: 14 IU/L (ref 0–40)
Albumin/Globulin Ratio: 1.9 (ref 1.1–2.5)
BUN/Creatinine Ratio: 10 (ref 9–20)
BUN: 11 mg/dL (ref 6–24)
Bilirubin Total: 0.2 mg/dL (ref 0.0–1.2)
CALCIUM: 9.3 mg/dL (ref 8.7–10.2)
CHLORIDE: 101 mmol/L (ref 97–108)
CO2: 25 mmol/L (ref 18–29)
CREATININE: 1.09 mg/dL (ref 0.76–1.27)
GFR, EST AFRICAN AMERICAN: 85 mL/min/{1.73_m2} (ref >59)
GFR, EST NON AFRICAN AMERICAN: 74 mL/min/{1.73_m2} (ref >59)
GLOBULIN, TOTAL: 2.2 g/dL (ref 1.5–4.5)
GLUCOSE: 101 mg/dL — AB (ref 65–99)
Potassium: 4.4 mmol/L (ref 3.5–5.2)
Sodium: 142 mmol/L (ref 134–144)
Total Protein: 6.4 g/dL (ref 6.0–8.5)

## 2015-01-17 LAB — LIPID PANEL
CHOL/HDL RATIO: 3.6 ratio (ref 0.0–5.0)
Cholesterol, Total: 163 mg/dL (ref 100–199)
HDL: 45 mg/dL (ref >39)
LDL CALC: 90 mg/dL (ref 0–99)
TRIGLYCERIDES: 138 mg/dL (ref 0–149)
VLDL Cholesterol Cal: 28 mg/dL (ref 5–40)

## 2015-01-20 LAB — NICOTINE/COTININE METABOLITES
Cotinine: NOT DETECTED ng/mL
Nicotine: NOT DETECTED ng/mL

## 2015-01-20 LAB — HEMOGLOBIN A1C
Est. average glucose Bld gHb Est-mCnc: 123 mg/dL
Hgb A1c MFr Bld: 5.9 % — ABNORMAL HIGH (ref 4.8–5.6)

## 2015-01-23 ENCOUNTER — Ambulatory Visit (INDEPENDENT_AMBULATORY_CARE_PROVIDER_SITE_OTHER): Payer: 59 | Admitting: Family Medicine

## 2015-01-23 ENCOUNTER — Encounter: Payer: Self-pay | Admitting: Family Medicine

## 2015-01-23 VITALS — BP 122/76 | HR 70 | Temp 98.6°F | Ht 70.0 in | Wt 236.0 lb

## 2015-01-23 DIAGNOSIS — E7800 Pure hypercholesterolemia, unspecified: Secondary | ICD-10-CM

## 2015-01-23 DIAGNOSIS — K219 Gastro-esophageal reflux disease without esophagitis: Secondary | ICD-10-CM | POA: Diagnosis not present

## 2015-01-23 DIAGNOSIS — Z0000 Encounter for general adult medical examination without abnormal findings: Secondary | ICD-10-CM

## 2015-01-23 DIAGNOSIS — F341 Dysthymic disorder: Secondary | ICD-10-CM

## 2015-01-23 DIAGNOSIS — Z7189 Other specified counseling: Secondary | ICD-10-CM | POA: Insufficient documentation

## 2015-01-23 DIAGNOSIS — Z1211 Encounter for screening for malignant neoplasm of colon: Secondary | ICD-10-CM

## 2015-01-23 MED ORDER — OMEPRAZOLE 20 MG PO CPDR: DELAYED_RELEASE_CAPSULE | ORAL | Status: DC

## 2015-01-23 NOTE — Assessment & Plan Note (Signed)
Would continue SSRI, doing well on med and he has no reason to stop.  He agrees.

## 2015-01-23 NOTE — Assessment & Plan Note (Signed)
Controlled, continue PPI.  Doing well.  D/w pt about weight loss.

## 2015-01-23 NOTE — Assessment & Plan Note (Signed)
Much improved from last year - was off statin at lab draw last year.  Tolerating med.  No ADE.  Continue as is.  D/w pt about diet and weight loss.

## 2015-01-23 NOTE — Progress Notes (Signed)
Pre visit review using our clinic review tool, if applicable. No additional management support is needed unless otherwise documented below in the visit note.  CPE- See plan.  Routine anticipatory guidance given to patient.  See health maintenance. Tetanus 2014 Shingles and PNA not due.  Flu shot encouraged. D/w pt.  D/w patient ZO:[id number] for colon cancer screening, including IFOB vs. colonoscopy. Risks and benefits of both were discussed and patient voiced understanding. Pt elects WUJ:WJXB.  Prostate cancer screening and PSA options (with potential risks and benefits of testing vs not testing) were discussed along with recent recs/guidelines. He declined testing PSA at this point. Living will d/w pt. Wife designated if patient were incapacitated.  Diet and exercise d/w pt. Diet is good. Exercise- limited due to knees, but he's doing as much as can. Mainly yard work.    Elevated Cholesterol: Using medications without problems:yes Muscle aches: no Diet compliance: yes Exercise:yes D/w pt about goal weight loss, ie 1lb per month.    Mood is controlled.  No ADE on med.  Wants to continue.  No reason to stop med in discussion with patient.   GERD controlled wo ADE on med. No blood in stool, no vomiting, no abd pain.  Compliant with med.   PMH and SH reviewed  Meds, vitals, and allergies reviewed.   ROS: See HPI.  Otherwise negative.    GEN: nad, alert and oriented HEENT: mucous membranes moist NECK: supple w/o LA CV: rrr. PULM: ctab, no inc wob ABD: soft, +bs EXT: no edema SKIN: no acute rash

## 2015-01-23 NOTE — Patient Instructions (Signed)
Go to the lab on the way out.  We'll contact you with your lab report. Take care.  Glad to see you.  Recheck in about 1 year at a physical.

## 2015-01-23 NOTE — Assessment & Plan Note (Signed)
Routine anticipatory guidance given to patient.  See health maintenance. Tetanus 2014 Shingles and PNA not due.   Flu shot encouraged.  D/w pt.  D/w patient re:options for colon cancer screening, including IFOB vs. colonoscopy.  Risks and benefits of both were discussed and patient voiced understanding.  Pt elects for:IFOB.   Prostate cancer screening and PSA options (with potential risks and benefits of testing vs not testing) were discussed along with recent recs/guidelines.  He declined testing PSA at this point. Living will d/w pt.  Wife designated if patient were incapacitated.   Diet and exercise d/w pt.  Diet is good.  Exercise- limited due to knees, but he's doing as much as can.  Mainly yard work.  

## 2015-02-05 ENCOUNTER — Other Ambulatory Visit (INDEPENDENT_AMBULATORY_CARE_PROVIDER_SITE_OTHER): Payer: 59

## 2015-02-05 DIAGNOSIS — Z1211 Encounter for screening for malignant neoplasm of colon: Secondary | ICD-10-CM

## 2015-02-05 LAB — FECAL OCCULT BLOOD, IMMUNOCHEMICAL: Fecal Occult Bld: NEGATIVE

## 2015-08-10 ENCOUNTER — Telehealth: Payer: Self-pay

## 2015-08-10 NOTE — Telephone Encounter (Signed)
Pt left v/m; pt has changed jobs and ins cos and request printed rx for omeprazole, sertraline and simvastatin to mail in to new pharmacy. Pt request cb when ready for pick up. 01/23/15 last annual.

## 2015-08-11 MED ORDER — SERTRALINE HCL 100 MG PO TABS
ORAL_TABLET | ORAL | Status: DC
Start: 2015-08-11 — End: 2016-03-22

## 2015-08-11 MED ORDER — OMEPRAZOLE 20 MG PO CPDR: DELAYED_RELEASE_CAPSULE | ORAL | Status: DC

## 2015-08-11 MED ORDER — SIMVASTATIN 40 MG PO TABS: ORAL_TABLET | ORAL | Status: DC

## 2015-08-11 NOTE — Telephone Encounter (Signed)
Patient called to check on his prescription.  Please call patient back at [phone].

## 2015-08-11 NOTE — Telephone Encounter (Signed)
Patient advised.  Rx left at front desk for pick up. 

## 2015-08-11 NOTE — Telephone Encounter (Signed)
Printed.  Thanks.  

## 2016-01-15 ENCOUNTER — Other Ambulatory Visit: Payer: Self-pay | Admitting: Family Medicine

## 2016-01-15 DIAGNOSIS — E7800 Pure hypercholesterolemia, unspecified: Secondary | ICD-10-CM

## 2016-01-18 ENCOUNTER — Other Ambulatory Visit: Payer: 59

## 2016-01-25 ENCOUNTER — Encounter: Payer: 59 | Admitting: Family Medicine

## 2016-03-03 ENCOUNTER — Other Ambulatory Visit: Payer: 59

## 2016-03-10 ENCOUNTER — Encounter: Payer: 59 | Admitting: Family Medicine

## 2016-03-17 ENCOUNTER — Other Ambulatory Visit (INDEPENDENT_AMBULATORY_CARE_PROVIDER_SITE_OTHER): Payer: 59

## 2016-03-17 ENCOUNTER — Other Ambulatory Visit: Payer: Self-pay

## 2016-03-17 DIAGNOSIS — E7800 Pure hypercholesterolemia, unspecified: Secondary | ICD-10-CM | POA: Diagnosis not present

## 2016-03-17 MED ORDER — SIMVASTATIN 40 MG PO TABS: ORAL_TABLET | ORAL | 0 refills | Status: DC

## 2016-03-17 NOTE — Telephone Encounter (Signed)
Pt has appt for CPXon 03/22/16 with Dr Para Marchuncan and pt request refill locally for simvastatin to [facility] until seen.pt has 5 pills left. Advised pt will send # 30 to give time to get mail order after seeing Dr Para Marchuncan. Pt voiced understanding.

## 2016-03-17 NOTE — Addendum Note (Signed)
Addended by: Baldomero LamyHAVERS, Yitzchak Kothari C on: 03/17/2016 08:36 AM   Modules accepted: Orders

## 2016-03-18 LAB — COMPREHENSIVE METABOLIC PANEL
ALBUMIN: 4.3 g/dL (ref 3.6–4.8)
ALK PHOS: 85 IU/L (ref 39–117)
ALT: 25 IU/L (ref 0–44)
AST: 23 IU/L (ref 0–40)
Albumin/Globulin Ratio: 1.7 (ref 1.2–2.2)
BUN / CREAT RATIO: 12 (ref 10–24)
BUN: 13 mg/dL (ref 8–27)
Bilirubin Total: 0.3 mg/dL (ref 0.0–1.2)
CO2: 25 mmol/L (ref 18–29)
CREATININE: 1.1 mg/dL (ref 0.76–1.27)
Calcium: 9.3 mg/dL (ref 8.6–10.2)
Chloride: 103 mmol/L (ref 96–106)
GFR calc non Af Amer: 73 mL/min/{1.73_m2} (ref >59)
GFR, EST AFRICAN AMERICAN: 84 mL/min/{1.73_m2} (ref >59)
GLOBULIN, TOTAL: 2.5 g/dL (ref 1.5–4.5)
Glucose: 88 mg/dL (ref 65–99)
Potassium: 4.4 mmol/L (ref 3.5–5.2)
SODIUM: 143 mmol/L (ref 134–144)
TOTAL PROTEIN: 6.8 g/dL (ref 6.0–8.5)

## 2016-03-18 LAB — LIPID PANEL
CHOL/HDL RATIO: 4.5 ratio (ref 0.0–5.0)
CHOLESTEROL TOTAL: 184 mg/dL (ref 100–199)
HDL: 41 mg/dL (ref >39)
LDL CALC: 99 mg/dL (ref 0–99)
Triglycerides: 221 mg/dL — ABNORMAL HIGH (ref 0–149)
VLDL CHOLESTEROL CAL: 44 mg/dL — AB (ref 5–40)

## 2016-03-22 ENCOUNTER — Encounter: Payer: Self-pay | Admitting: Family Medicine

## 2016-03-22 ENCOUNTER — Ambulatory Visit (INDEPENDENT_AMBULATORY_CARE_PROVIDER_SITE_OTHER): Payer: 59 | Admitting: Family Medicine

## 2016-03-22 VITALS — BP 130/72 | HR 66 | Temp 98.2°F | Ht 70.0 in | Wt 234.2 lb

## 2016-03-22 DIAGNOSIS — K219 Gastro-esophageal reflux disease without esophagitis: Secondary | ICD-10-CM

## 2016-03-22 DIAGNOSIS — Z0000 Encounter for general adult medical examination without abnormal findings: Secondary | ICD-10-CM | POA: Diagnosis not present

## 2016-03-22 DIAGNOSIS — F341 Dysthymic disorder: Secondary | ICD-10-CM

## 2016-03-22 DIAGNOSIS — Z1211 Encounter for screening for malignant neoplasm of colon: Secondary | ICD-10-CM

## 2016-03-22 DIAGNOSIS — E7800 Pure hypercholesterolemia, unspecified: Secondary | ICD-10-CM

## 2016-03-22 MED ORDER — SERTRALINE HCL 100 MG PO TABS
ORAL_TABLET | ORAL | 3 refills | Status: DC
Start: 2016-03-22 — End: 2017-02-18

## 2016-03-22 MED ORDER — SIMVASTATIN 40 MG PO TABS: ORAL_TABLET | ORAL | 3 refills | Status: DC

## 2016-03-22 MED ORDER — OMEPRAZOLE 20 MG PO CPDR: DELAYED_RELEASE_CAPSULE | ORAL | 3 refills | Status: DC

## 2016-03-22 NOTE — Patient Instructions (Addendum)
Go to the lab on the way out.  We'll contact you with your lab report.  Check with your insurance to see if they will cover the shingles shot. Try to slowly taper off omeprazole.   Take care.  Glad to see you.  I would get a flu shot each fall.   Update me as needed.

## 2016-03-22 NOTE — Progress Notes (Signed)
Pre visit review using our clinic review tool, if applicable. No additional management support is needed unless otherwise documented below in the visit note. 

## 2016-03-22 NOTE — Progress Notes (Signed)
CPE- See plan.  Routine anticipatory guidance given to patient.  See health maintenance. Tetanus 2014  Shingles d/w pt.  PNA not due.  Flu shot encouraged. D/w pt.  Declined.  D/w patient WJ:[id number]:options for colon cancer screening, including IFOB vs. colonoscopy. Risks and benefits of both were discussed and patient voiced understanding. Pt elects NFA:OZHYfor:IFOB.  Prostate cancer screening and PSA options (with potential risks and benefits of testing vs not testing) were discussed along with recent recs/guidelines. He declined testing PSA at this point.  [facility] and HCV screening done ~[facility].   Living will d/w pt. Wife designated if patient were incapacitated.  Diet and exercise d/w pt. Diet is good. Exercise- limited due to knees, but he's doing as much as can. Mainly yard work, also walking for exercise.    GERD.  Controlled with med.  No ADE on med. D/w pt about trial of taper off medicine.    Mood. Controlled with med.  No ADE on med.  He wanted to continue med.  Mood is more "level" with less variation on med.   Elevated Cholesterol: Using medications without problems:yes Muscle aches: no Diet compliance:yes Exercise:yes  Mild inc in TG noted, unclear significance.  He is still working on diet, etc.   PMH and SH reviewed  Meds, vitals, and allergies reviewed.   ROS: Per HPI.  Unless specifically indicated otherwise in HPI, the patient denies:  General: fever. Eyes: acute vision changes ENT: sore throat Cardiovascular: chest pain Respiratory: SOB GI: vomiting GU: dysuria Musculoskeletal: acute back pain Derm: acute rash Neuro: acute motor dysfunction Psych: worsening mood Endocrine: polydipsia Heme: bleeding Allergy: hayfever  GEN: nad, alert and oriented HEENT: mucous membranes moist NECK: supple w/o LA CV: rrr. PULM: ctab, no inc wob ABD: soft, +bs EXT: no edema SKIN: no acute rash

## 2016-03-23 NOTE — Assessment & Plan Note (Signed)
Mood stable. Okay for outpatient follow-up. Doing well. He wanted to continue medication. I agree.

## 2016-03-23 NOTE — Assessment & Plan Note (Signed)
Discussed with patient about attempting a slow taper off PPI. He agrees.

## 2016-03-23 NOTE — Assessment & Plan Note (Signed)
Continue current medication. Continue work on diet and exercise. Discussed with patient.

## 2016-03-23 NOTE — Assessment & Plan Note (Signed)
Tetanus 2014  Shingles d/w pt.  PNA not due.  Flu shot encouraged. D/w pt.  Declined.  D/w patient WU:[id number]:options for colon cancer screening, including IFOB vs. colonoscopy. Risks and benefits of both were discussed and patient voiced understanding. Pt elects NWG:NFAOfor:IFOB.  Prostate cancer screening and PSA options (with potential risks and benefits of testing vs not testing) were discussed along with recent recs/guidelines. He declined testing PSA at this point.  HIV and HCV screening done ~[facility].   Living will d/w pt. Wife designated if patient were incapacitated.  Diet and exercise d/w pt. Diet is good. Exercise- limited due to knees, but he's doing as much as can. Mainly yard work, also walking for exercise.

## 2016-04-06 ENCOUNTER — Other Ambulatory Visit (INDEPENDENT_AMBULATORY_CARE_PROVIDER_SITE_OTHER): Payer: 59

## 2016-04-06 DIAGNOSIS — Z1211 Encounter for screening for malignant neoplasm of colon: Secondary | ICD-10-CM

## 2016-04-06 LAB — FECAL OCCULT BLOOD, IMMUNOCHEMICAL: Fecal Occult Bld: NEGATIVE

## 2017-02-18 ENCOUNTER — Other Ambulatory Visit: Payer: Self-pay | Admitting: Family Medicine

## 2017-03-29 ENCOUNTER — Ambulatory Visit (INDEPENDENT_AMBULATORY_CARE_PROVIDER_SITE_OTHER): Payer: 59 | Admitting: Family Medicine

## 2017-03-29 ENCOUNTER — Encounter: Payer: Self-pay | Admitting: Family Medicine

## 2017-03-29 VITALS — BP 138/74 | HR 77 | Temp 98.7°F | Wt 235.0 lb

## 2017-03-29 DIAGNOSIS — J069 Acute upper respiratory infection, unspecified: Secondary | ICD-10-CM

## 2017-03-29 MED ORDER — AZITHROMYCIN 250 MG PO TABS: ORAL_TABLET | ORAL | 0 refills | Status: DC

## 2017-03-29 NOTE — Progress Notes (Signed)
Subjective:    Patient ID: Dennis Lambert, male    DOB: [birth year], 61 y.o.   MRN: [id number]  HPI This is a 61 yo male who presents today with 4 days of nasal congestion, fever to 101, sneezing, cough (dry), some clear nasal drainage, no sore throat. No wheezing, feels a little winded.  Has been using saline spray, aspirin. Ears feel stopped up. Feels a little better today.  Was at a wedding recently, several people sick.   Past Medical History:  Diagnosis Date  . Dysthymic disorder    and mood swings controlled on zoloft  . GERD (gastroesophageal reflux disease)   . Hyperlipidemia   . Hypertension    Past Surgical History:  Procedure Laterality Date  . CHOLECYSTECTOMY  04/28/01   Dennis Lambert(Dennis Lambert)  . Hepatobiliary scan  04/17/01   +  . UPPER GASTROINTESTINAL ENDOSCOPY  ~94   Reflux   Family History  Problem Relation Age of Onset  . Hypertension Mother   . Heart disease Mother        CHF  . Kidney disease Mother        Renal disease  . Cancer Mother        lung cancer  . Fibromyalgia Sister   . COPD Father   . Heart disease Other   . Hypertension Other   . Stroke Other   . Depression Neg Hx   . Alcohol abuse Neg Hx   . Drug abuse Neg Hx   . Colon cancer Neg Hx   . Prostate cancer Neg Hx    Social History  Substance Use Topics  . Smoking status: Never Smoker  . Smokeless tobacco: Never Used  . Alcohol use No      Review of Systems Per HPI    Objective:   Physical Exam  Constitutional: He is oriented to person, place, and time. He appears well-developed and well-nourished.  HENT:  Head: Normocephalic and atraumatic.  Right Ear: Tympanic membrane, external ear and ear canal normal.  Left Ear: Tympanic membrane, external ear and ear canal normal.  Nose: Mucosal edema and rhinorrhea present.  Mouth/Throat: Uvula is midline and mucous membranes are normal. Posterior oropharyngeal erythema present. No oropharyngeal exudate, posterior oropharyngeal edema or  tonsillar abscesses.  Cardiovascular: Normal rate, regular rhythm and normal heart sounds.   Pulmonary/Chest: Effort normal and breath sounds normal. No respiratory distress. He has no wheezes. He has no rales.  Occasional dry cough observed.   Neurological: He is alert and oriented to person, place, and time.  Skin: Skin is warm and dry.  Psychiatric: He has a normal mood and affect. His behavior is normal. Judgment and thought content normal.  Vitals reviewed.     BP 138/74 (BP Location: Left Arm, Patient Position: Sitting, Cuff Size: Normal)   Pulse 77   Temp 98.7 F (37.1 C) (Oral)   Wt 235 lb (106.6 kg)   SpO2 96%   BMI 33.72 kg/m  Wt Readings from Last 3 Encounters:  03/29/17 235 lb (106.6 kg)  03/22/16 234 lb 4 oz (106.3 kg)  01/23/15 236 lb (107 kg)       Assessment & Plan:  1. URI with cough and congestion -Provided written and verbal information regarding diagnosis and treatment. - likely viral, provided wait and see antibiotic and instructions for symptomatic treatment Patient Instructions  For nasal congestion you can use Afrin nasal spray for 3 days max, Sudafed, saline nasal spray (generic is fine for all). For cough  you can try Delsym, can add Mucinex (generic is fine) Drink enough fluids to make your urine light yellow. If not better in 3-4 days, please start antibiotic For fever/chill/muscle aches you can take over the counter acetaminophen or ibuprofen.  Please come back in if you are not better in 5-7 days or if you develop wheezing, shortness of breath or persistent vomiting.    - azithromycin (ZITHROMAX) 250 MG tablet; Take 2 tabs PO x 1 dose, then 1 tab PO QD x 4 days  Dispense: 6 tablet; Refill: 0   Dennis Ree, FNP-BC  [facility], [facility]  04/04/2017 8:31 AM

## 2017-03-29 NOTE — Patient Instructions (Addendum)
For nasal congestion you can use Afrin nasal spray for 3 days max, Sudafed, saline nasal spray (generic is fine for all). For cough you can try Delsym, can add Mucinex (generic is fine) Drink enough fluids to make your urine light yellow. If not better in 3-4 days, please start antibiotic For fever/chill/muscle aches you can take over the counter acetaminophen or ibuprofen.  Please come back in if you are not better in 5-7 days or if you develop wheezing, shortness of breath or persistent vomiting.

## 2017-05-11 ENCOUNTER — Telehealth: Payer: Self-pay | Admitting: Family Medicine

## 2017-05-11 DIAGNOSIS — E785 Hyperlipidemia, unspecified: Secondary | ICD-10-CM

## 2017-05-12 NOTE — Telephone Encounter (Signed)
Called patient and set up annual exam for 06/27/17 with a lab visit on 06/20/17.   Will refill X 1 (3mth supply) to carry patient until he comes in for appointment.  Dr. Para Marchuncan,   Please place annual lab orders for patient.  Thanks.

## 2017-05-14 NOTE — Addendum Note (Signed)
Addended by: Joaquim NamUNCAN, Joyceann Kruser S on: 05/14/2017 08:19 PM   Modules accepted: Orders

## 2017-05-14 NOTE — Telephone Encounter (Signed)
Ordered. Thanks

## 2017-06-20 ENCOUNTER — Other Ambulatory Visit (INDEPENDENT_AMBULATORY_CARE_PROVIDER_SITE_OTHER): Payer: Managed Care, Other (non HMO)

## 2017-06-20 DIAGNOSIS — E785 Hyperlipidemia, unspecified: Secondary | ICD-10-CM | POA: Diagnosis not present

## 2017-06-20 NOTE — Addendum Note (Signed)
Addended by: Shannen Vernon J on: 06/20/2017 08:26 AM   Modules accepted: Orders  

## 2017-06-20 NOTE — Addendum Note (Signed)
Addended by: Alvina ChouWALSH, Jolette Lana J on: 06/20/2017 08:26 AM   Modules accepted: Orders

## 2017-06-21 LAB — COMPREHENSIVE METABOLIC PANEL
ALK PHOS: 85 IU/L (ref 39–117)
ALT: 26 IU/L (ref 0–44)
AST: 22 IU/L (ref 0–40)
Albumin/Globulin Ratio: 1.5 (ref 1.2–2.2)
Albumin: 4.3 g/dL (ref 3.6–4.8)
BILIRUBIN TOTAL: 0.3 mg/dL (ref 0.0–1.2)
BUN/Creatinine Ratio: 13 (ref 10–24)
BUN: 14 mg/dL (ref 8–27)
CALCIUM: 9.5 mg/dL (ref 8.6–10.2)
CHLORIDE: 101 mmol/L (ref 96–106)
CO2: 24 mmol/L (ref 20–29)
Creatinine, Ser: 1.1 mg/dL (ref 0.76–1.27)
GFR calc Af Amer: 83 mL/min/{1.73_m2} (ref >59)
GFR calc non Af Amer: 72 mL/min/{1.73_m2} (ref >59)
Globulin, Total: 2.9 g/dL (ref 1.5–4.5)
Glucose: 98 mg/dL (ref 65–99)
POTASSIUM: 5 mmol/L (ref 3.5–5.2)
Sodium: 142 mmol/L (ref 134–144)
Total Protein: 7.2 g/dL (ref 6.0–8.5)

## 2017-06-21 LAB — LIPID PANEL
CHOLESTEROL TOTAL: 158 mg/dL (ref 100–199)
Chol/HDL Ratio: 3.6 ratio (ref 0.0–5.0)
HDL: 44 mg/dL (ref >39)
LDL Calculated: 72 mg/dL (ref 0–99)
TRIGLYCERIDES: 210 mg/dL — AB (ref 0–149)
VLDL Cholesterol Cal: 42 mg/dL — ABNORMAL HIGH (ref 5–40)

## 2017-06-27 ENCOUNTER — Encounter: Payer: Self-pay | Admitting: Family Medicine

## 2017-06-27 ENCOUNTER — Ambulatory Visit (INDEPENDENT_AMBULATORY_CARE_PROVIDER_SITE_OTHER): Payer: Managed Care, Other (non HMO) | Admitting: Family Medicine

## 2017-06-27 VITALS — BP 110/74 | HR 70 | Temp 98.4°F | Ht 70.0 in | Wt 236.5 lb

## 2017-06-27 DIAGNOSIS — F341 Dysthymic disorder: Secondary | ICD-10-CM

## 2017-06-27 DIAGNOSIS — Z9119 Patient's noncompliance with other medical treatment and regimen: Secondary | ICD-10-CM | POA: Diagnosis not present

## 2017-06-27 DIAGNOSIS — Z1211 Encounter for screening for malignant neoplasm of colon: Secondary | ICD-10-CM

## 2017-06-27 DIAGNOSIS — Z91199 Patient's noncompliance with other medical treatment and regimen due to unspecified reason: Secondary | ICD-10-CM

## 2017-06-27 DIAGNOSIS — K219 Gastro-esophageal reflux disease without esophagitis: Secondary | ICD-10-CM | POA: Diagnosis not present

## 2017-06-27 DIAGNOSIS — E7800 Pure hypercholesterolemia, unspecified: Secondary | ICD-10-CM | POA: Diagnosis not present

## 2017-06-27 DIAGNOSIS — Z0000 Encounter for general adult medical examination without abnormal findings: Secondary | ICD-10-CM | POA: Diagnosis not present

## 2017-06-27 DIAGNOSIS — Z7189 Other specified counseling: Secondary | ICD-10-CM

## 2017-06-27 MED ORDER — SERTRALINE HCL 100 MG PO TABS: 100.0000 mg | ORAL_TABLET | Freq: Every day | ORAL | 3 refills | Status: DC

## 2017-06-27 MED ORDER — OMEPRAZOLE 20 MG PO CPDR: 20.0000 mg | DELAYED_RELEASE_CAPSULE | Freq: Every day | ORAL | 3 refills | Status: DC

## 2017-06-27 MED ORDER — SIMVASTATIN 40 MG PO TABS: 40.0000 mg | ORAL_TABLET | Freq: Every day | ORAL | 3 refills | Status: DC

## 2017-06-27 NOTE — Progress Notes (Signed)
CPE- See plan.  Routine anticipatory guidance given to patient.  See health maintenance.  The possibility exists that previously documented standard health maintenance information may have been brought forward from a previous encounter into this note.  If needed, that same information has been updated to reflect the current situation based on today's encounter.    Tetanus 2014  Shingles d/w pt.  PNA not due.  Flu shot encouraged. D/w pt.  Declined.   D/w patient ZO:XWRUEAVre:options for colon cancer screening, including IFOB vs. colonoscopy. Risks and benefits of both were discussed and patient voiced understanding. Pt elects WUJ:WJXBfor:IFOB.   Prostate cancer screening and PSA options (with potential risks and benefits of testing vs not testing) were discussed along with recent recs/guidelines. He declined testing PSA at this point.  HIV and HCV screening done ~[facility].   Living will d/w pt. Wife designated if patient were incapacitated.  Diet and exercise d/w pt. Diet is good. Exercise- walking for exercise 2.165miles at a time.   GERD controlled with PPI, no ADE on med.  He failed taper off med.  He is working on diet and exercise.   Elevated Cholesterol: Using medications without problems:yes Muscle aches: no Diet compliance:yes Exercise:yes  Mood d/w pt.  Still on sertraline at baseline. Mood is good.  Mood is more "level" with less variation on med.  No SI/HI.    PMH and SH reviewed  Meds, vitals, and allergies reviewed.   ROS: Per HPI.  Unless specifically indicated otherwise in HPI, the patient denies:  General: fever. Eyes: acute vision changes ENT: sore throat Cardiovascular: chest pain Respiratory: SOB GI: vomiting GU: dysuria Musculoskeletal: acute back pain Derm: acute rash Neuro: acute motor dysfunction Psych: worsening mood Endocrine: polydipsia Heme: bleeding Allergy: hayfever  GEN: nad, alert and oriented HEENT: mucous membranes moist NECK: supple w/o LA CV:  rrr. PULM: ctab, no inc wob ABD: soft, +bs EXT: no edema SKIN: no acute rash

## 2017-06-27 NOTE — Patient Instructions (Signed)
Don't change your meds for now.   Take care.  Glad to see you. Update me as needed.   

## 2017-06-28 DIAGNOSIS — Z91199 Patient's noncompliance with other medical treatment and regimen due to unspecified reason: Secondary | ICD-10-CM | POA: Insufficient documentation

## 2017-06-28 DIAGNOSIS — Z9119 Patient's noncompliance with other medical treatment and regimen: Secondary | ICD-10-CM | POA: Insufficient documentation

## 2017-06-28 NOTE — Assessment & Plan Note (Signed)
Reasonable to continue current medication.  See above.  No adverse effect to medication. 

## 2017-06-28 NOTE — Assessment & Plan Note (Signed)
Reasonable to continue current medication.  See above.  No adverse effect to medication.

## 2017-06-28 NOTE — Assessment & Plan Note (Signed)
Discussed with patient about vaccination specifically and his medical care in general.  We talked about routine vaccinations over the years, at prior visits and again today.  I corrected him previously about  misinformation he had gotten about flu vaccination.  I corrected him again today.  He is apparently basing his current decision on inaccurate information.  He has every reason to get vaccinated and no contraindication.  He still refuses. A bigger point is his routine medical care in general.  It does not make sense to come in for a physical, to get good anticipatory guidance, and then decline the advice.  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 He is noncompliant.

## 2017-06-28 NOTE — Assessment & Plan Note (Signed)
Living will d/w pt.  Wife designated if patient were incapacitated.   ?

## 2017-06-28 NOTE — Assessment & Plan Note (Signed)
Tetanus 2014  Shingles d/w pt.  PNA not due.  Flu shot encouraged. D/w pt.  Declined.   D/w patient ZO:XWRUEAVre:options for colon cancer screening, including IFOB vs. colonoscopy. Risks and benefits of both were discussed and patient voiced understanding. Pt elects WUJ:WJXBfor:IFOB.   Prostate cancer screening and PSA options (with potential risks and benefits of testing vs not testing) were discussed along with recent recs/guidelines. He declined testing PSA at this point.  HIV and HCV screening done ~[facility] at [facility].   Living will d/w pt. Wife designated if patient were incapacitated.  Diet and exercise d/w pt. Diet is good. Exercise- walking for exercise 2.365miles at a time.

## 2017-07-03 ENCOUNTER — Encounter: Payer: Self-pay | Admitting: Family Medicine

## 2017-07-03 ENCOUNTER — Ambulatory Visit: Payer: Managed Care, Other (non HMO) | Admitting: Family Medicine

## 2017-07-03 VITALS — BP 126/76 | HR 69 | Temp 97.9°F | Wt 236.5 lb

## 2017-07-03 DIAGNOSIS — R1030 Lower abdominal pain, unspecified: Secondary | ICD-10-CM

## 2017-07-03 DIAGNOSIS — M549 Dorsalgia, unspecified: Secondary | ICD-10-CM | POA: Diagnosis not present

## 2017-07-03 LAB — POC URINALSYSI DIPSTICK (AUTOMATED)
BILIRUBIN UA: NEGATIVE
Blood, UA: NEGATIVE
GLUCOSE UA: NEGATIVE
Ketones, UA: NEGATIVE
LEUKOCYTES UA: NEGATIVE
NITRITE UA: NEGATIVE
Protein, UA: NEGATIVE
Spec Grav, UA: 1.01 (ref 1.010–1.025)
Urobilinogen, UA: 0.2 E.U./dL
pH, UA: 6 (ref 5.0–8.0)

## 2017-07-03 NOTE — Patient Instructions (Signed)
Likely a combination of nerve irritation and muscle spasm that is positionally induced with the recliner.  If you are going to sit in that chair, then use an extra pillow behind your back or adjust the tilt on the chair.  We'll contact you with your lab report. I don't think this is a kidney stone or kidney problem.  Take care.  Glad to see you.

## 2017-07-03 NOTE — Progress Notes (Signed)
New sx since last OV.  He was sitting down on 06/30/17.  In the R lower back.  Sig pain at the time.  He took some aspirin and then about 2 hours later he went to lay down and felt better.  No sx the next day until back in the same chair on 07/01/17.  He took aspirin again, went to lay down and pain went away.  Same sx last night and laying down helped, w/o taking aspirin.    He strained his urine in the meantime, no blood in urine, no stones in urine, no dysuria.    He has been exercising and doing crunches, using a 10 lbs dumbbell.    No L sided sx.  No FCVD.  No radicular leg sx.  When the pain was at its worse, it did radiate around to the R side of the abdomen.    Until recently he usually doesn't sit in that chair much.    Meds, vitals, and allergies reviewed.   ROS: Per HPI unless specifically indicated in ROS section   GEN: nad, alert and oriented NECK: supple w/o LA CV: rrr.  PULM: ctab, no inc wob ABD: soft, +bs EXT: no edema SKIN: no acute rash R flank not ttp, no midline back pain.  Able to bear weight, no weakness in the BLE, sensation intact.

## 2017-07-04 DIAGNOSIS — M549 Dorsalgia, unspecified: Secondary | ICD-10-CM | POA: Insufficient documentation

## 2017-07-04 NOTE — Assessment & Plan Note (Signed)
Likely a combination of nerve irritation and muscle spasm that is positionally induced with the recliner.  If sitting in that chair, then use an extra pillow behind the back or adjust the tilt on the chair.  U/a neg.  I don't think this is a kidney stone or kidney problem.

## 2017-07-05 ENCOUNTER — Other Ambulatory Visit: Payer: Managed Care, Other (non HMO)

## 2017-07-05 LAB — FECAL OCCULT BLOOD, IMMUNOCHEMICAL: FECAL OCCULT BLD: NEGATIVE

## 2017-07-05 NOTE — Addendum Note (Signed)
Addended by: Roena MaladyEVONTENNO, Ashia Dehner Y on: 07/05/2017 01:30 PM   Modules accepted: Orders

## 2017-07-12 ENCOUNTER — Other Ambulatory Visit (INDEPENDENT_AMBULATORY_CARE_PROVIDER_SITE_OTHER): Payer: Managed Care, Other (non HMO)

## 2017-07-12 ENCOUNTER — Encounter: Payer: Self-pay | Admitting: Family Medicine

## 2017-07-12 ENCOUNTER — Telehealth: Payer: Self-pay | Admitting: Family Medicine

## 2017-07-12 DIAGNOSIS — N200 Calculus of kidney: Secondary | ICD-10-CM

## 2017-07-12 NOTE — Telephone Encounter (Signed)
Copied from CRM [phone]. Topic: General - Other >> Jul 12, 2017 12:11 PM Stephannie LiSimmons, Aquarius Tremper L, NT wrote: Reason for CRM: Patient called with questions in regards to a possible kidney stone ,patient says he passed a stone yesterday and wants to know if [facility] should collect the specimen please advise ,he would like a return call as well .

## 2017-07-12 NOTE — Telephone Encounter (Signed)
I didn't expect that.  I'm glad he caught the stone.   I put in the order for stone analysis through [facility].   He can drop off the stone but he needs to be put on the lab schedule.  Is he having any pain now?  Any other symptoms? Let me know.  Thanks for calling patient and for the update from the patient.

## 2017-07-12 NOTE — Telephone Encounter (Signed)
Patient notified as instructed by telephone and verbalized understanding. Patient stated that he is not having any plan and didn't even have pain when he passed the stone.  Patient stated that he is not having any other symptoms. Lab appointment scheduled for patient to drop the stone off today.

## 2017-07-12 NOTE — Telephone Encounter (Signed)
Noted. Thanks.  I'll await the lab report.   

## 2017-07-14 LAB — STONE ANALYSIS
Ca Oxalate,Monohydr.: 97 %
Ca phos cry stone ql IR: 3 %
Stone Weight: 26 mg

## 2017-07-17 ENCOUNTER — Other Ambulatory Visit: Payer: Self-pay | Admitting: Family Medicine

## 2017-07-24 ENCOUNTER — Other Ambulatory Visit: Payer: Self-pay | Admitting: Family Medicine

## 2018-05-31 ENCOUNTER — Other Ambulatory Visit: Payer: Self-pay | Admitting: Family Medicine

## 2018-06-01 ENCOUNTER — Encounter: Payer: Self-pay | Admitting: *Deleted

## 2018-06-18 ENCOUNTER — Encounter: Payer: Self-pay | Admitting: *Deleted

## 2018-06-18 ENCOUNTER — Other Ambulatory Visit: Payer: Self-pay | Admitting: *Deleted

## 2018-06-18 MED ORDER — OMEPRAZOLE 20 MG PO CPDR: 20.0000 mg | DELAYED_RELEASE_CAPSULE | Freq: Every day | ORAL | 0 refills | Status: DC

## 2018-06-18 MED ORDER — SERTRALINE HCL 100 MG PO TABS: 100.0000 mg | ORAL_TABLET | Freq: Every day | ORAL | 0 refills | Status: DC

## 2018-06-18 MED ORDER — SIMVASTATIN 40 MG PO TABS: 40.0000 mg | ORAL_TABLET | Freq: Every day | ORAL | 0 refills | Status: DC

## 2018-07-10 ENCOUNTER — Other Ambulatory Visit: Payer: Self-pay | Admitting: Family Medicine

## 2018-07-10 ENCOUNTER — Other Ambulatory Visit (INDEPENDENT_AMBULATORY_CARE_PROVIDER_SITE_OTHER): Payer: Managed Care, Other (non HMO)

## 2018-07-10 DIAGNOSIS — E7800 Pure hypercholesterolemia, unspecified: Secondary | ICD-10-CM | POA: Diagnosis not present

## 2018-07-10 NOTE — Addendum Note (Signed)
Addended by: Iyannah Blake J on: 07/10/2018 07:45 AM   Modules accepted: Orders  

## 2018-07-10 NOTE — Addendum Note (Signed)
Addended by: Alvina Chou on: 07/10/2018 07:45 AM   Modules accepted: Orders

## 2018-07-11 LAB — COMPREHENSIVE METABOLIC PANEL
ALT: 28 IU/L (ref 0–44)
AST: 22 IU/L (ref 0–40)
Albumin/Globulin Ratio: 1.6 (ref 1.2–2.2)
Albumin: 4.4 g/dL (ref 3.8–4.8)
Alkaline Phosphatase: 94 IU/L (ref 39–117)
BUN/Creatinine Ratio: 16 (ref 10–24)
BUN: 17 mg/dL (ref 8–27)
Bilirubin Total: <0.2 mg/dL (ref 0.0–1.2)
CO2: 23 mmol/L (ref 20–29)
Calcium: 9.4 mg/dL (ref 8.6–10.2)
Chloride: 104 mmol/L (ref 96–106)
Creatinine, Ser: 1.08 mg/dL (ref 0.76–1.27)
GFR calc Af Amer: 85 mL/min/{1.73_m2} (ref >59)
GFR calc non Af Amer: 73 mL/min/{1.73_m2} (ref >59)
Globulin, Total: 2.7 g/dL (ref 1.5–4.5)
Glucose: 104 mg/dL — ABNORMAL HIGH (ref 65–99)
Potassium: 4.6 mmol/L (ref 3.5–5.2)
SODIUM: 141 mmol/L (ref 134–144)
Total Protein: 7.1 g/dL (ref 6.0–8.5)

## 2018-07-11 LAB — LIPID PANEL
Chol/HDL Ratio: 3.6 ratio (ref 0.0–5.0)
Cholesterol, Total: 164 mg/dL (ref 100–199)
HDL: 45 mg/dL (ref >39)
LDL Calculated: 92 mg/dL (ref 0–99)
TRIGLYCERIDES: 136 mg/dL (ref 0–149)
VLDL Cholesterol Cal: 27 mg/dL (ref 5–40)

## 2018-07-17 ENCOUNTER — Encounter: Payer: Self-pay | Admitting: Family Medicine

## 2018-07-17 ENCOUNTER — Ambulatory Visit (INDEPENDENT_AMBULATORY_CARE_PROVIDER_SITE_OTHER): Payer: Managed Care, Other (non HMO) | Admitting: Family Medicine

## 2018-07-17 VITALS — BP 122/70 | HR 64 | Temp 98.3°F | Ht 70.0 in | Wt 236.5 lb

## 2018-07-17 DIAGNOSIS — F341 Dysthymic disorder: Secondary | ICD-10-CM

## 2018-07-17 DIAGNOSIS — E7800 Pure hypercholesterolemia, unspecified: Secondary | ICD-10-CM

## 2018-07-17 DIAGNOSIS — K219 Gastro-esophageal reflux disease without esophagitis: Secondary | ICD-10-CM

## 2018-07-17 DIAGNOSIS — Z0000 Encounter for general adult medical examination without abnormal findings: Secondary | ICD-10-CM | POA: Diagnosis not present

## 2018-07-17 DIAGNOSIS — Z7189 Other specified counseling: Secondary | ICD-10-CM

## 2018-07-17 MED ORDER — OMEPRAZOLE 20 MG PO CPDR: 20.0000 mg | DELAYED_RELEASE_CAPSULE | Freq: Every day | ORAL | 3 refills | Status: DC

## 2018-07-17 MED ORDER — SIMVASTATIN 40 MG PO TABS: 40.0000 mg | ORAL_TABLET | Freq: Every day | ORAL | 3 refills | Status: DC

## 2018-07-17 MED ORDER — SERTRALINE HCL 100 MG PO TABS: 100.0000 mg | ORAL_TABLET | Freq: Every day | ORAL | 3 refills | Status: DC

## 2018-07-17 NOTE — Patient Instructions (Addendum)
We will get the cologuard test sent to you.  Check with your insurance to see if they will cover it.   Think about getting a flu shot.  Take care.  Glad to see you.

## 2018-07-17 NOTE — Progress Notes (Signed)
CPE- See plan.  Routine anticipatory guidance given to patient.  See health maintenance.  The possibility exists that previously documented standard health maintenance information may have been brought forward from a previous encounter into this note.  If needed, that same information has been updated to reflect the current situation based on today's encounter.    Tetanus 2014  Shingles d/w pt.  Out of stock.   PNA not due.  Flu shot encouraged. D/w pt. Declined.    He does not have a medical reason not to get vaccinated. D/w patient [id number] for colon cancer screening, including IFOB vs. colonoscopy. Risks and benefits of both were discussed and patient voiced understanding. Pt elects for: cologuard.   Prostate cancer screening and PSA options (with potential risks and benefits of testing vs not testing) were discussed along with recent recs/guidelines. He declined testing PSA at this point.  [facility] and HCV [facility] ~[facility].  Living will d/w pt. Wife designated if patient were incapacitated.  Diet and exercise d/w pt. Diet is good. Exercise- walking for exercise 2.44miles at a time.   Elevated Cholesterol: Using medications without problems:yes Muscle aches: no Diet compliance: yes Exercise:yes No chest pain, no shortness of breath.  Able to exercise without difficulty.  Mood d/w pt.  Still on SSRi.  He wanted to continue.  He didn't feel well off med.  No SI/HI.  Mood swings were clearly better on med.  No ADE on med.  "It made a world of difference."  GERD.  He failed trial off med.  GERD controlled on med.  D/w pt.   PMH and SH reviewed  Meds, vitals, and allergies reviewed.   ROS: Per HPI.  Unless specifically indicated otherwise in HPI, the patient denies:  General: fever. Eyes: acute vision changes ENT: sore throat Cardiovascular: chest pain Respiratory: SOB GI: vomiting GU: dysuria Musculoskeletal: acute back pain Derm: acute rash Neuro: acute motor  dysfunction Psych: worsening mood Endocrine: polydipsia Heme: bleeding Allergy: hayfever  GEN: nad, alert and oriented HEENT: mucous membranes moist NECK: supple w/o LA CV: rrr. PULM: ctab, no inc wob ABD: soft, +bs EXT: no edema SKIN: no acute rash

## 2018-07-18 NOTE — Assessment & Plan Note (Signed)
Controlled with PPI.  No symptoms on med.  Failed trial off med, with return of symptoms.  Continue as is.  Continue work on diet and exercise.

## 2018-07-18 NOTE — Assessment & Plan Note (Signed)
Improved on SSRI.  He wanted to continue.  No suicidal homicidal intent.  Okay for outpatient follow-up.

## 2018-07-18 NOTE — Assessment & Plan Note (Signed)
Tetanus 2014  Shingles d/w pt.  Out of stock.   PNA not due.  Flu shot encouraged. D/w pt. Declined.   D/w patient [id number] for colon cancer screening, including IFOB vs. colonoscopy. Risks and benefits of both were discussed and patient voiced understanding. Pt elects for: cologuard.   Prostate cancer screening and PSA options (with potential risks and benefits of testing vs not testing) were discussed along with recent recs/guidelines. He declined testing PSA at this point.  [facility] and HCV screening done ~[facility].  Living will d/w pt. Wife designated if patient were incapacitated.  Diet and exercise d/w pt. Diet is good. Exercise- walking for exercise 2.52miles at a time.

## 2018-07-18 NOTE — Assessment & Plan Note (Signed)
Living will d/w pt.  Wife designated if patient were incapacitated.   ?

## 2018-07-18 NOTE — Assessment & Plan Note (Signed)
Labs discussed with patient.  Continue simvastatin.  Continue work on diet and exercise.

## 2018-08-04 LAB — COLOGUARD: Cologuard: NEGATIVE

## 2018-08-13 ENCOUNTER — Telehealth: Payer: Self-pay

## 2018-08-13 ENCOUNTER — Encounter: Payer: Self-pay | Admitting: Family Medicine

## 2018-08-13 NOTE — Telephone Encounter (Signed)
Pt said he spoke with cologuard and was advised that the cologuard results were faxed to [facility] on 08/06/18. Pt request cb with results. (I apologize I did not see results in pts chart: not sure if in paper fax form).

## 2018-08-13 NOTE — Telephone Encounter (Signed)
Patient advised.

## 2018-08-14 NOTE — Telephone Encounter (Signed)
I thought this was neg, thanks for notifying patient.  Thanks.

## 2018-10-13 ENCOUNTER — Other Ambulatory Visit: Payer: Self-pay

## 2018-10-13 NOTE — Telephone Encounter (Signed)
Received fax for refills on omeprazole and simvastatin. Sent fax back stating rxs were sent 07-17-18 #90/3

## 2018-10-23 ENCOUNTER — Telehealth: Payer: Self-pay | Admitting: *Deleted

## 2018-10-23 NOTE — Telephone Encounter (Signed)
Patient left a voicemail stating that yesterday am he was having symptoms of a kidney stone like he has had previously. Patient stated that he is feeling just fine now, no symptoms. Patient stated that he just wanted to let Dr. Para March know in case he wants to do any test on him.

## 2018-10-24 NOTE — Telephone Encounter (Signed)
If he isn't having sx, then I wouldn't do anything differently at this point.  If he has recurrent issues or ongoing pain then we need to check him in the office.  I would keep drinking plenty of fluids in the meantime.  Thanks for the update.

## 2018-10-24 NOTE — Telephone Encounter (Signed)
Spoke to pt. No dysuria, no back pain. He is feeling better after more BMs.

## 2018-10-26 ENCOUNTER — Ambulatory Visit (INDEPENDENT_AMBULATORY_CARE_PROVIDER_SITE_OTHER)
Admission: RE | Admit: 2018-10-26 | Discharge: 2018-10-26 | Disposition: A | Discharge status: Home / Self Care | Payer: Managed Care, Other (non HMO) | Source: Ambulatory Visit | Attending: Family Medicine | Admitting: Family Medicine

## 2018-10-26 ENCOUNTER — Ambulatory Visit: Payer: Managed Care, Other (non HMO) | Admitting: Family Medicine

## 2018-10-26 ENCOUNTER — Telehealth: Payer: Self-pay | Admitting: *Deleted

## 2018-10-26 ENCOUNTER — Other Ambulatory Visit: Payer: Self-pay

## 2018-10-26 ENCOUNTER — Encounter: Payer: Self-pay | Admitting: Family Medicine

## 2018-10-26 VITALS — BP 118/64 | HR 66 | Temp 98.3°F | Ht 70.0 in | Wt 235.4 lb

## 2018-10-26 DIAGNOSIS — N200 Calculus of kidney: Secondary | ICD-10-CM

## 2018-10-26 DIAGNOSIS — R1030 Lower abdominal pain, unspecified: Secondary | ICD-10-CM | POA: Diagnosis not present

## 2018-10-26 LAB — POCT URINALYSIS DIPSTICK
Bilirubin, UA: NEGATIVE
Blood, UA: NEGATIVE
Clarity, UA: NEGATIVE
Glucose, UA: NEGATIVE
Ketones, UA: NEGATIVE
Leukocytes, UA: NEGATIVE
Nitrite, UA: NEGATIVE
Protein, UA: NEGATIVE
Spec Grav, UA: <=1.005 — AB (ref 1.010–1.025)
Urobilinogen, UA: 0.2 E.U./dL
pH, UA: 6 (ref 5.0–8.0)

## 2018-10-26 IMAGING — DX ABDOMEN - 1 VIEW
2 series · 2 of 2 positions shown · non-contrast
Comparison: None.

CLINICAL DATA: Lower abdominal pain.  Question right renal stones.

EXAM:
ABDOMEN - 1 VIEW

[abdomen kub (1 of 2)]
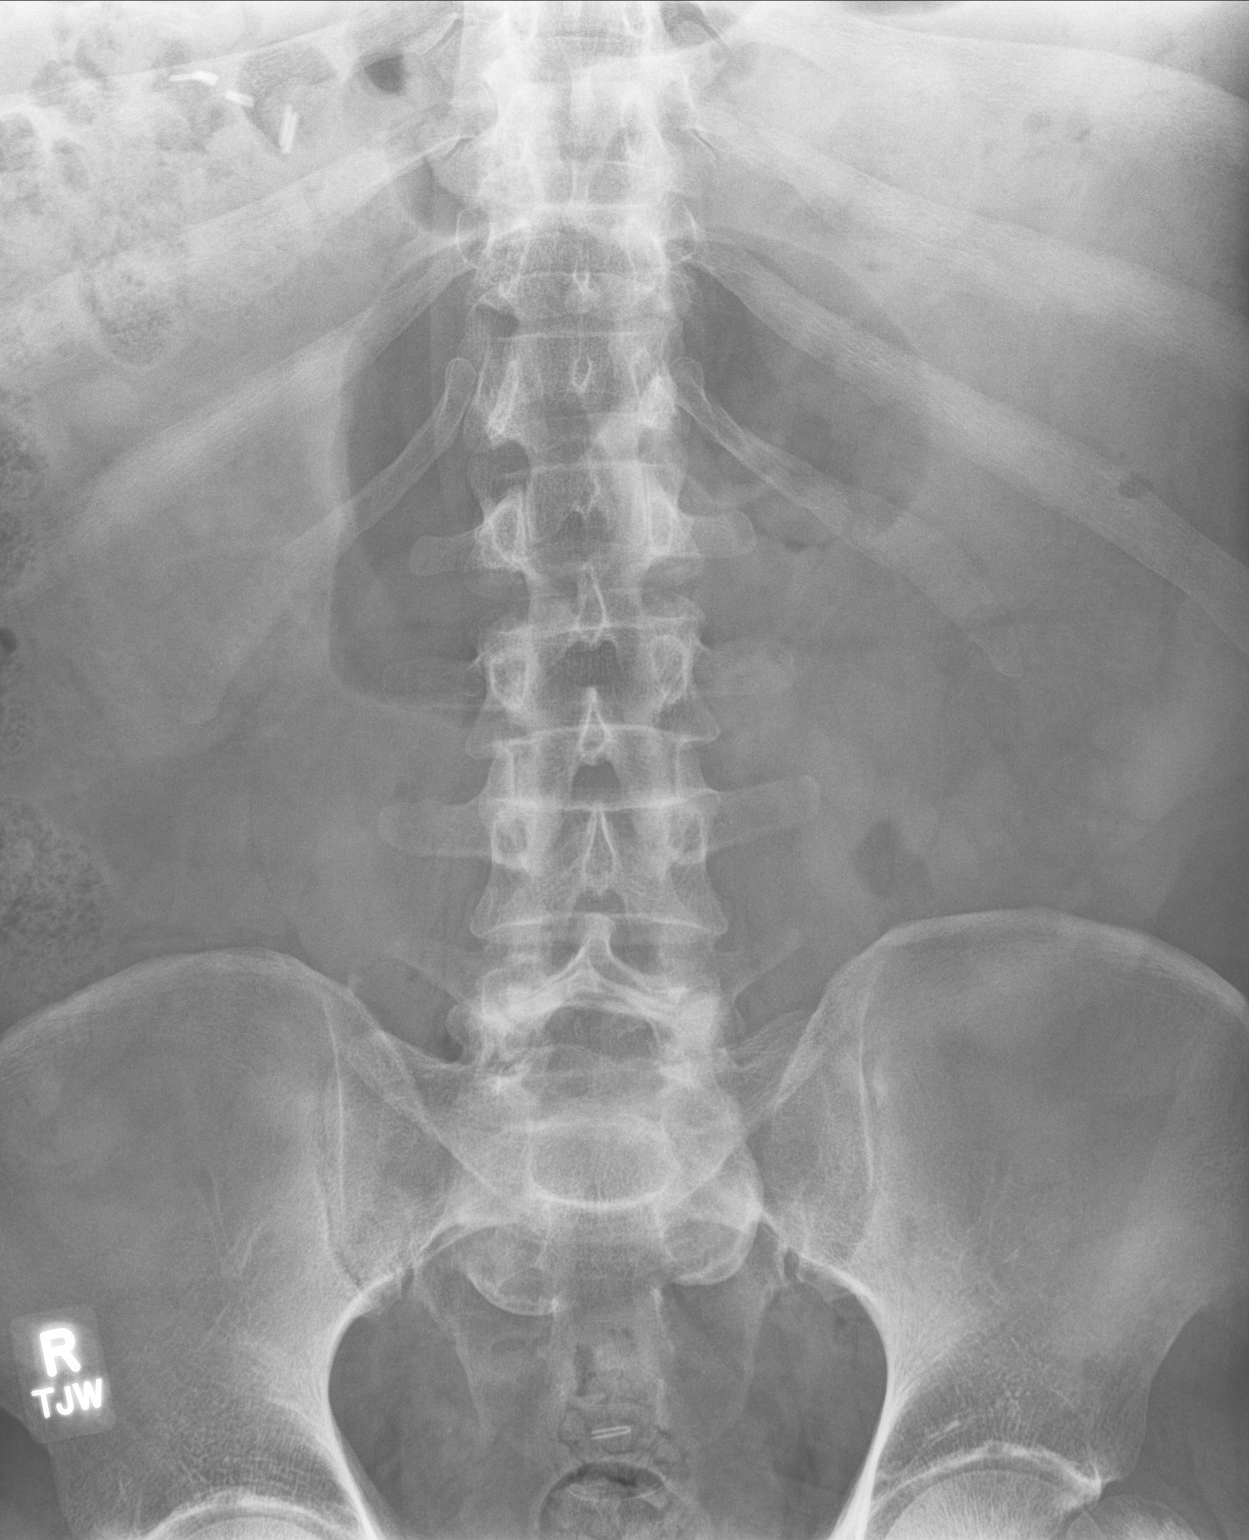

[abdomen kub (2 of 2)]
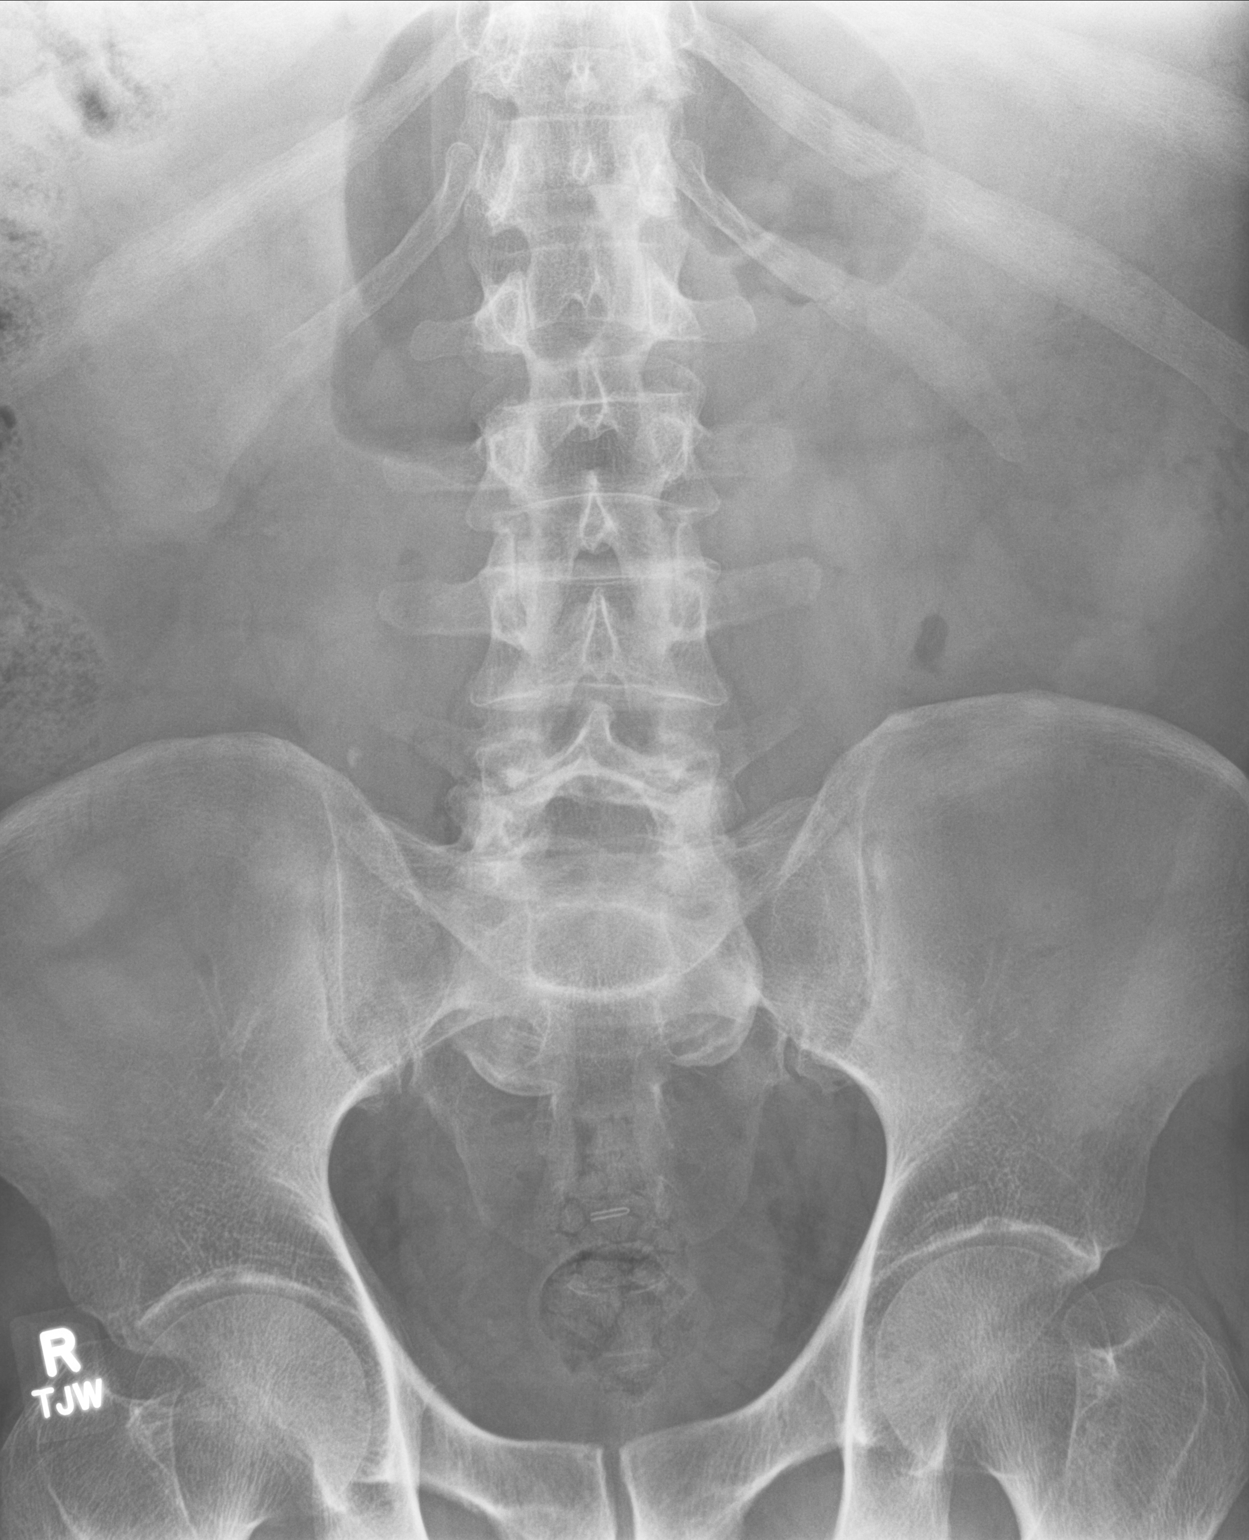

[2 of 2 positions shown; findings below may reference images not displayed]

FINDINGS: Large amount of fecal matter in the colon. Clips in the right upper
quadrant consistent with previous cholecystectomy. No visible stones
overlying either kidney. There is a 3 x 4 mm stone inferior and
lateral to the right transverse process of L5 that could represent a
ureteral stone. 1-2 mm calcification in the right pelvis is
indeterminate for a phlebolith versus stone. Patient has some
osteoarthritis of SI joints and lower lumbar degenerative changes.
IMPRESSION: 3 x 4 mm calcification on the right that could be a ureteral stone
at about the L5 level. Indeterminate 1-2 mm calcification in the
right pelvis could be a ureteral stone or phlebolith.

## 2018-10-26 MED ORDER — TAMSULOSIN HCL 0.4 MG PO CAPS: 0.4000 mg | ORAL_CAPSULE | Freq: Every day | ORAL | 1 refills | Status: DC

## 2018-10-26 MED ORDER — HYDROCODONE-ACETAMINOPHEN 5-325 MG PO TABS: 1.0000 | ORAL_TABLET | Freq: Four times a day (QID) | ORAL | 0 refills | Status: DC | PRN

## 2018-10-26 NOTE — Telephone Encounter (Signed)
At a minimum (with him having recurrent sx), we should at least check his urine.  I think it makes more sense to get OV set up for today.  We can check u/a at this visit, I can check him, we can get KUB done if needed.  If he is going to have potential ongoing sx, then we should make some plans in the meantime.   Please offer/encourage OV this afternoon.  Thanks.

## 2018-10-26 NOTE — Patient Instructions (Signed)
Start flomax, use vicodin if needed with sedation caution.  Drink enough fluid to keep your urine clear.  Update me Monday.  Call back as needed in the meantime.  Take care.  Glad to see you.

## 2018-10-26 NOTE — Telephone Encounter (Signed)
Patient left a voicemail stating that  Monday he may have had a kidney stone. Patient stated that yesterday late he had severe pain on right side of his back for about 4-5 hours then the pain moved to his lower abdomen and then went away. Patient stated that he is fine this morning. Patient wants to know what Dr. Para March recommends?

## 2018-10-26 NOTE — Telephone Encounter (Signed)
Thanks

## 2018-10-26 NOTE — Telephone Encounter (Signed)
I spoke with pt and he scheduled 30' appt with Dr Para March 10/26/18 at 2 pm. Pt is not having any pain right now. FYI to Dr Para March. No fever, chills,SOB,cough,S/T,muscle pain, diarrhea,H/A or loss of taste or smell. No travel and no known exposure to covid or flu.

## 2018-10-26 NOTE — Progress Notes (Signed)
R flank pain.  Had 2 episodes this week with pain.  Off extra K supplement.  No L sided sx.  No FCNAVD.  No blood seen in urine.  No blood with BM.  Quick onset of pain.  It was initially higher up on the right flank but later on he had the pain lower down on the right flank.  Never had any left-sided symptoms.  H/o kidney stones in the past.  No pain now  Meds, vitals, and allergies reviewed.   ROS: Per HPI unless specifically indicated in ROS section   GEN: nad, alert and oriented HEENT: mucous membranes moist NECK: supple w/o LA CV: rrr PULM: ctab, no inc wob ABD: soft, +bs EXT: no edema SKIN: Well-perfused

## 2018-10-28 NOTE — Assessment & Plan Note (Signed)
Likely the issue.  See notes on imaging.  In the meantime, start flomax, use vicodin if needed with sedation caution.  Drink enough fluid to keep urine clear.  Update me Monday.  Call back as needed in the meantime.  He agrees with plan.  Nontoxic.

## 2018-10-29 ENCOUNTER — Telehealth: Payer: Self-pay | Admitting: *Deleted

## 2018-10-29 NOTE — Telephone Encounter (Signed)
Spoke to pt

## 2018-10-29 NOTE — Telephone Encounter (Signed)
I think he may be in the midst of passing that and I would still continue the flomax and fluids for now.   I'm glad he isn't having sx.  Update me as needed.

## 2018-10-29 NOTE — Telephone Encounter (Signed)
Patent left a voicemail stating that he was to call with an update on his kidney stone today. Patient stated that so far, so good. Patient stated that everything is back to normal. Patient stated that he is not having any pain and his urine is clear. Patient stated that he reviewed the x-ray on his kidneys and he wants to know what Dr. Para March thinks about the 4-5 mm stone around his L-5?

## 2018-11-27 ENCOUNTER — Other Ambulatory Visit: Payer: Self-pay

## 2018-11-27 ENCOUNTER — Ambulatory Visit: Payer: Managed Care, Other (non HMO) | Admitting: Family Medicine

## 2018-11-27 ENCOUNTER — Telehealth: Payer: Self-pay

## 2018-11-27 ENCOUNTER — Encounter: Payer: Self-pay | Admitting: Family Medicine

## 2018-11-27 ENCOUNTER — Ambulatory Visit (INDEPENDENT_AMBULATORY_CARE_PROVIDER_SITE_OTHER)
Admission: RE | Admit: 2018-11-27 | Discharge: 2018-11-27 | Disposition: A | Discharge status: Home / Self Care | Payer: Managed Care, Other (non HMO) | Source: Ambulatory Visit | Attending: Family Medicine | Admitting: Family Medicine

## 2018-11-27 VITALS — BP 144/80 | HR 70 | Temp 98.1°F | Ht 70.0 in | Wt 237.4 lb

## 2018-11-27 DIAGNOSIS — R300 Dysuria: Secondary | ICD-10-CM | POA: Diagnosis not present

## 2018-11-27 DIAGNOSIS — N200 Calculus of kidney: Secondary | ICD-10-CM

## 2018-11-27 LAB — POC URINALSYSI DIPSTICK (AUTOMATED)
Bilirubin, UA: NEGATIVE
Glucose, UA: NEGATIVE
Ketones, UA: NEGATIVE
Leukocytes, UA: NEGATIVE
Nitrite, UA: NEGATIVE
Protein, UA: NEGATIVE
Spec Grav, UA: 1.02 (ref 1.010–1.025)
Urobilinogen, UA: 0.2 E.U./dL
pH, UA: 6 (ref 5.0–8.0)

## 2018-11-27 IMAGING — DX ABDOMEN - 1 VIEW
2 series · 2 of 2 positions shown · non-contrast
Comparison: KUB [DATE]

CLINICAL DATA: Evaluate for renal stones. Pain moved from flank to
groin area.

EXAM:
ABDOMEN - 1 VIEW

[abdomen kub (1 of 2)]
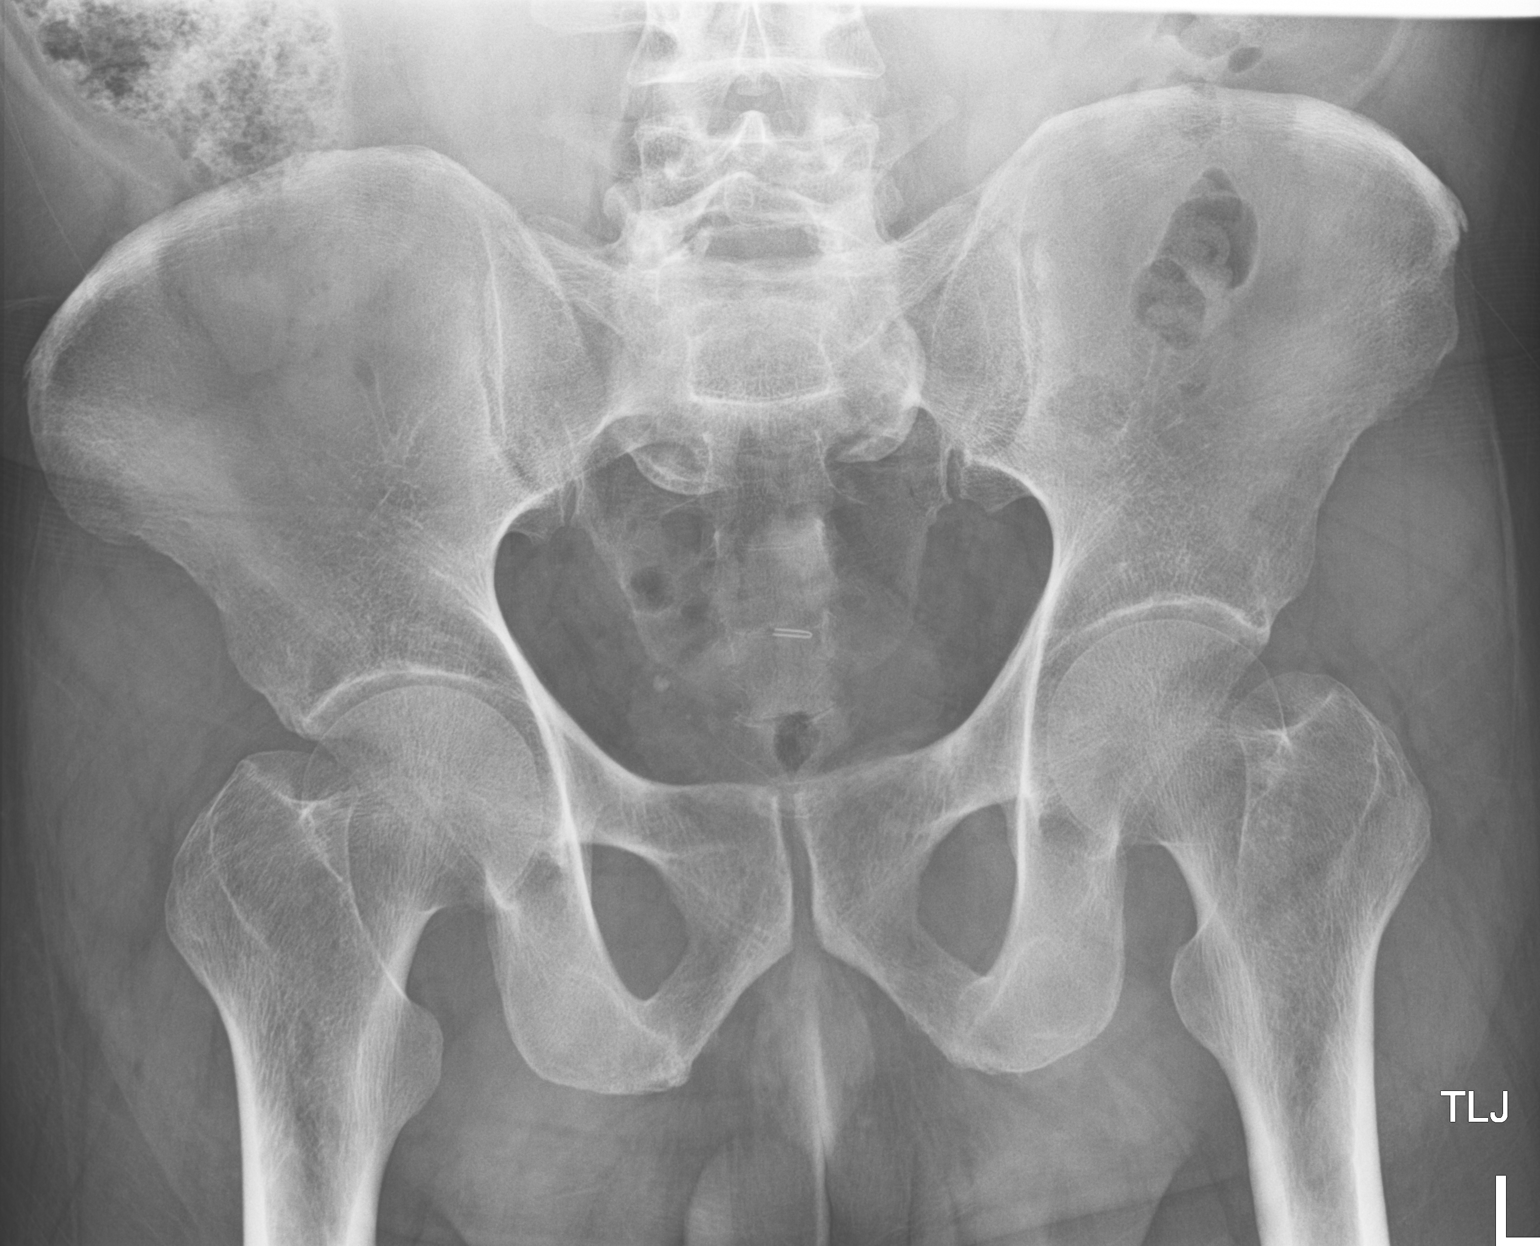

[abdomen kub (2 of 2)]
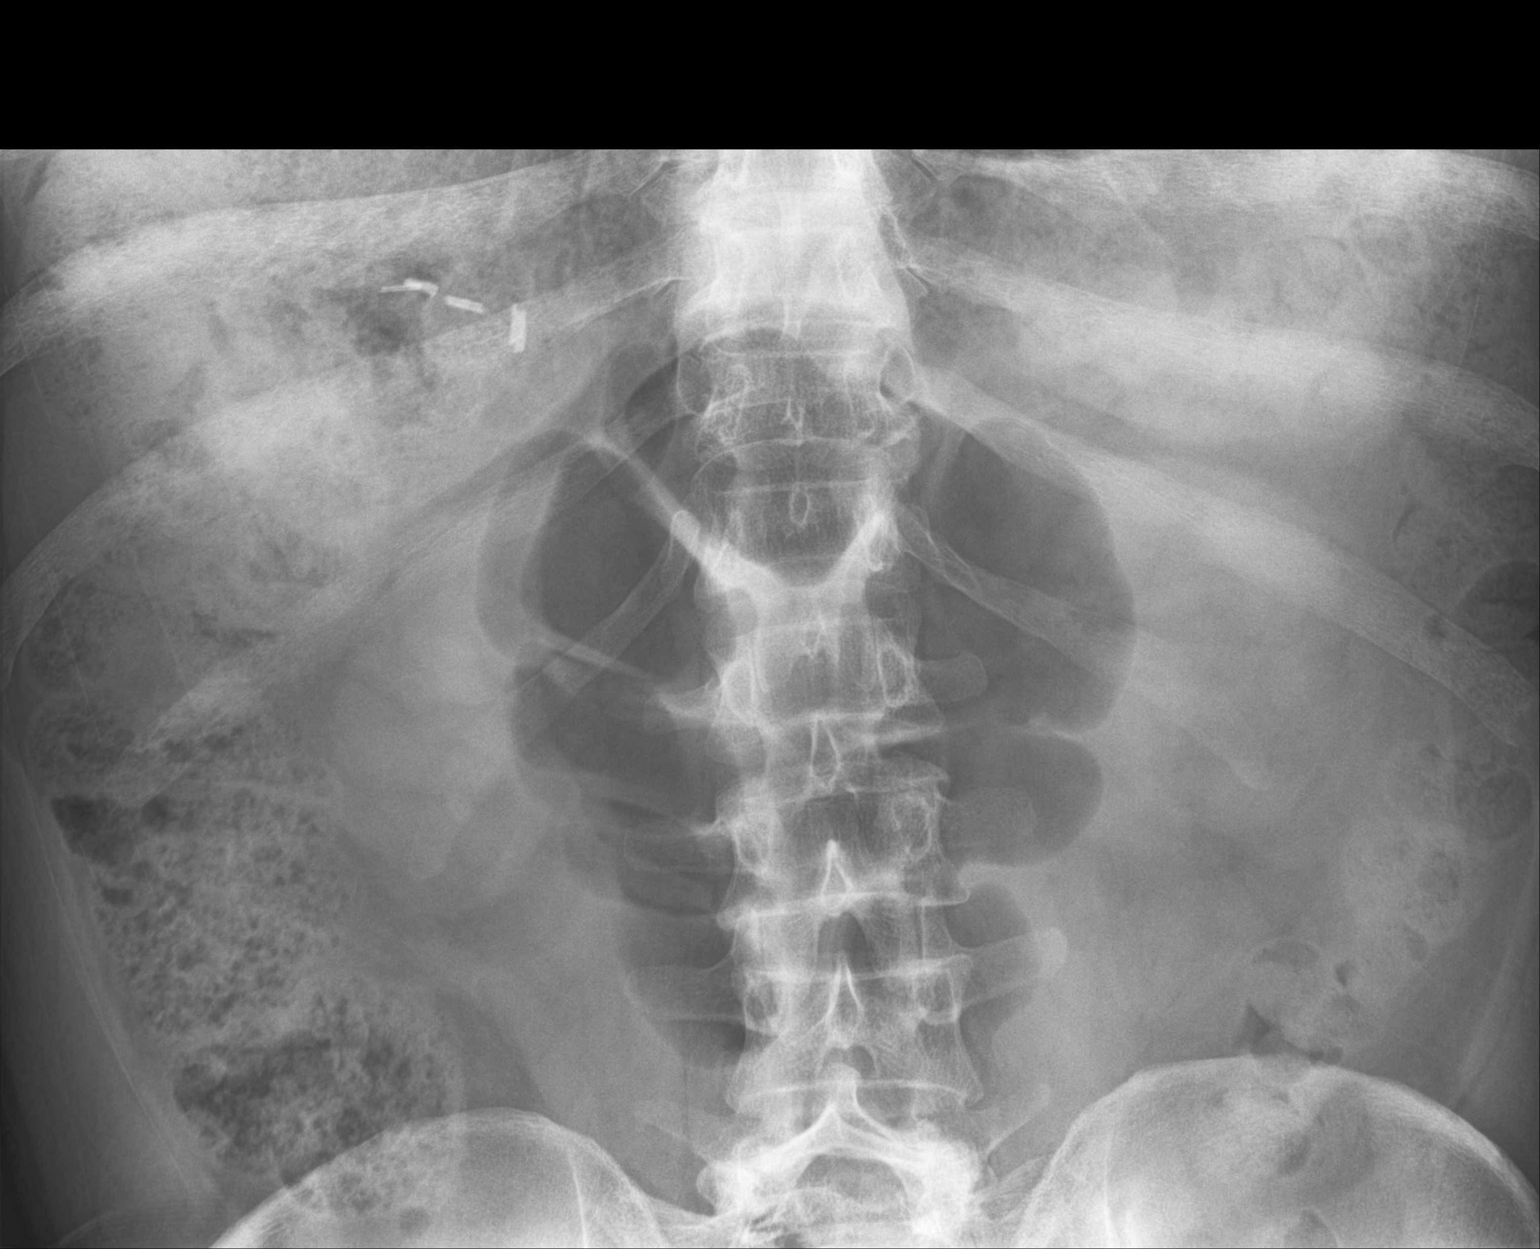

[2 of 2 positions shown; findings below may reference images not displayed]

FINDINGS: Bowel gas pattern is nonobstructive with mild fecal retention over
the colon. Surgical clips over the right upper quadrant. No definite
stones over the kidneys. The previously seen 4 mm calcification
adjacent the right L4 spinous process has moved distally and now is
located in the expected region of the right UVJ likely a distal
right ureteral stone. Single surgical clip over the midline pelvis.
Remainder the exam is unchanged.
IMPRESSION: Interval movement of presumed 4 mm right ureteral stone now located
at the right UVJ.

Nonobstructive bowel gas pattern.

## 2018-11-27 NOTE — Progress Notes (Signed)
Dysuria.  About 1 week after last OV, he felt R flank pain that moved lower.  It subsided and he was back to baseline.  Then 4 days ago he had R lower abd pain.  No L sided sx.  More recently with burning with urination.  Still with lower abd pressure but some better now.  He has some B testicle pain intermittently.  No fevers, no vomiting.  No diarrhea.  He used OTC dip with possible + leuk on home urine testing.    U/a at clinic d/w pt.  He hasn't had to use hydrocodone yet.    Meds, vitals, and allergies reviewed.   ROS: Per HPI unless specifically indicated in ROS section   nad ncat rrr ctab abd soft, minimally tender to palpation in suprapubic area without rebound.  Normal bowels Extremities well perfused.

## 2018-11-27 NOTE — Patient Instructions (Addendum)
We'll culture your urine.  Go to the lab on the way out.  We'll contact you with your xray report. Keep drinking plenty of water.  Use the pain medicine if needed.  Take care.  Glad to see you.

## 2018-11-27 NOTE — Telephone Encounter (Signed)
Pt said about 1 month ago dx with kidney stone; pt has been taking flomax and thinks stone has moved to bladder. For 2-3 days pt has feeling of pressure and some pain in testicles. Lower abd discomfort; pain level 4-5. Pt used OTC UTI test strip that showed ? Trace of leukocytes. Pt is concerned has UTI. No covid symptoms, no travel and no known exposure to covid or flu. Pt scheduled in office visit today at 2:30.

## 2018-11-27 NOTE — Telephone Encounter (Signed)
Thanks

## 2018-11-28 NOTE — Assessment & Plan Note (Addendum)
Likely with stone migration.  I still expect him to pass this stone.  Use pain medicine as needed.  Reasonable not to restart Flomax.  Continue adequate fluid intake.  Update me as needed.  He agrees.  Imaging discussed with patient at office visit.  See orders.  Okay for outpatient follow-up.

## 2018-11-28 NOTE — Telephone Encounter (Signed)
See OV note.  Thanks.  

## 2018-11-29 ENCOUNTER — Encounter: Payer: Self-pay | Admitting: Family Medicine

## 2018-11-29 LAB — URINE CULTURE: Organism ID, Bacteria: NO GROWTH

## 2018-12-02 ENCOUNTER — Encounter: Payer: Self-pay | Admitting: Family Medicine

## 2018-12-03 ENCOUNTER — Telehealth: Payer: Self-pay | Admitting: Family Medicine

## 2018-12-03 ENCOUNTER — Encounter: Payer: Self-pay | Admitting: Surgery

## 2018-12-03 NOTE — Telephone Encounter (Signed)
Dr. Hassell Done, this patient had a cholecystectomy years ago.  He had a question about a single surgical clip over the midline pelvis.  Was this placed at a trocar site?  I did not know how to answer his question and I would like your input.  Many thanks.  I greatly appreciate your help.

## 2018-12-06 ENCOUNTER — Telehealth: Payer: Self-pay | Admitting: Family Medicine

## 2018-12-06 NOTE — Telephone Encounter (Signed)
Please call patient.  I checked with Dr. Hassell Done and he reviewed the old operative note.  It likely that a clip in the midline pelvis came from the clips that were applied to the cystic duct--the tip of the duct sloughs and the clip could migrate downward. These are titanium clips and are inert, so it should not cause a problem.  The surgeons clip the cystic duct multiple times, as a safety precaution.  I do not think he is having any symptoms from this and I do not think he has to do anything about it.  However, if he has problems or questions Dr. Hassell Done would be glad to see him in the office and discuss this with him.   Please let me know if he needs help getting an appointment set up.  Thanks.

## 2018-12-06 NOTE — Telephone Encounter (Signed)
Left detailed message on voicemail to check MyChart, then spoke with patient.

## 2019-07-27 ENCOUNTER — Other Ambulatory Visit: Payer: Self-pay | Admitting: Family Medicine

## 2019-07-29 NOTE — Telephone Encounter (Signed)
Please call patient and CPE. Send back to CMA for refill after physical has been scheduled.

## 2019-07-29 NOTE — Telephone Encounter (Signed)
Patient is scheduled for CPE and labs.

## 2019-08-04 ENCOUNTER — Other Ambulatory Visit: Payer: Self-pay | Admitting: Family Medicine

## 2019-08-04 DIAGNOSIS — E7800 Pure hypercholesterolemia, unspecified: Secondary | ICD-10-CM

## 2019-08-06 ENCOUNTER — Telehealth: Payer: Self-pay

## 2019-08-06 NOTE — Telephone Encounter (Signed)
LVM to call clinic, needs COVID screen 2.23.2021 TLJ

## 2019-08-08 ENCOUNTER — Other Ambulatory Visit (INDEPENDENT_AMBULATORY_CARE_PROVIDER_SITE_OTHER): Payer: Managed Care, Other (non HMO)

## 2019-08-08 ENCOUNTER — Other Ambulatory Visit: Payer: Self-pay

## 2019-08-08 DIAGNOSIS — E7800 Pure hypercholesterolemia, unspecified: Secondary | ICD-10-CM | POA: Diagnosis not present

## 2019-08-08 NOTE — Addendum Note (Signed)
Addended by: Alvina Chou on: 08/08/2019 07:47 AM   Modules accepted: Orders

## 2019-08-09 LAB — COMPREHENSIVE METABOLIC PANEL
ALT: 25 IU/L (ref 0–44)
AST: 24 IU/L (ref 0–40)
Albumin/Globulin Ratio: 2 (ref 1.2–2.2)
Albumin: 4.3 g/dL (ref 3.8–4.8)
Alkaline Phosphatase: 86 IU/L (ref 39–117)
BUN/Creatinine Ratio: 13 (ref 10–24)
BUN: 14 mg/dL (ref 8–27)
Bilirubin Total: 0.4 mg/dL (ref 0.0–1.2)
CO2: 25 mmol/L (ref 20–29)
Calcium: 9.1 mg/dL (ref 8.6–10.2)
Chloride: 102 mmol/L (ref 96–106)
Creatinine, Ser: 1.06 mg/dL (ref 0.76–1.27)
GFR calc Af Amer: 86 mL/min/{1.73_m2} (ref >59)
GFR calc non Af Amer: 74 mL/min/{1.73_m2} (ref >59)
Globulin, Total: 2.2 g/dL (ref 1.5–4.5)
Glucose: 94 mg/dL (ref 65–99)
Potassium: 4.5 mmol/L (ref 3.5–5.2)
Sodium: 139 mmol/L (ref 134–144)
Total Protein: 6.5 g/dL (ref 6.0–8.5)

## 2019-08-09 LAB — LIPID PANEL
Chol/HDL Ratio: 3.7 ratio (ref 0.0–5.0)
Cholesterol, Total: 149 mg/dL (ref 100–199)
HDL: 40 mg/dL (ref >39)
LDL Chol Calc (NIH): 77 mg/dL (ref 0–99)
Triglycerides: 189 mg/dL — ABNORMAL HIGH (ref 0–149)
VLDL Cholesterol Cal: 32 mg/dL (ref 5–40)

## 2019-08-15 ENCOUNTER — Ambulatory Visit (INDEPENDENT_AMBULATORY_CARE_PROVIDER_SITE_OTHER): Payer: Managed Care, Other (non HMO) | Admitting: Family Medicine

## 2019-08-15 ENCOUNTER — Encounter: Payer: Self-pay | Admitting: Family Medicine

## 2019-08-15 ENCOUNTER — Other Ambulatory Visit: Payer: Self-pay

## 2019-08-15 VITALS — BP 110/76 | HR 71 | Temp 97.4°F | Ht 70.0 in | Wt 238.3 lb

## 2019-08-15 DIAGNOSIS — E7800 Pure hypercholesterolemia, unspecified: Secondary | ICD-10-CM

## 2019-08-15 DIAGNOSIS — K219 Gastro-esophageal reflux disease without esophagitis: Secondary | ICD-10-CM

## 2019-08-15 DIAGNOSIS — Z0000 Encounter for general adult medical examination without abnormal findings: Secondary | ICD-10-CM | POA: Diagnosis not present

## 2019-08-15 DIAGNOSIS — F341 Dysthymic disorder: Secondary | ICD-10-CM

## 2019-08-15 DIAGNOSIS — Z7189 Other specified counseling: Secondary | ICD-10-CM

## 2019-08-15 NOTE — Progress Notes (Signed)
This visit occurred during the SARS-CoV-2 public health emergency.  Safety protocols were in place, including screening questions prior to the visit, additional usage of staff PPE, and extensive cleaning of exam room while observing appropriate contact time as indicated for disinfecting solutions.  CPE- See plan.  Routine anticipatory guidance given to patient.  See health maintenance.  The possibility exists that previously documented standard health maintenance information may have been brought forward from a previous encounter into this note.  If needed, that same information has been updated to reflect the current situation based on today's encounter.     Tetanus 2014  Shingles d/w pt.   PNA not due.  Flu shot encouraged. D/w pt. Declined.  covid vaccine encouraged.  Declined by patient.   cologuard 2020.  Prostate cancer screening and PSA options (with potential risks and benefits of testing vs not testing) were discussed along with recent recs/guidelines. He declined testing PSA at this point.  [facility].  Living will d/w pt. Wife designated if patient were incapacitated.  Diet and exercise encouraged.  He is active.    GERD controlled with PPI use.    Elevated Cholesterol: Using medications without problems: yes Muscle aches: no Diet compliance: yes Exercise: yes Labs d/w pt.    Mood d/w pt.  No ADE on med. Compliant.  No SI/HI.  He thought med helped.    PMH and SH reviewed  Meds, vitals, and allergies reviewed.   ROS: Per HPI.  Unless specifically indicated otherwise in HPI, the patient denies other complaints.    GEN: nad, alert and oriented HEENT: ncat NECK: supple w/o LA CV: rrr. PULM: ctab, no inc wob ABD: soft, +bs EXT: no edema SKIN: well perfused.

## 2019-08-15 NOTE — Patient Instructions (Signed)
Don't change your meds for now.  I would still get routine vaccinations done per CDC guideline.  Take care.

## 2019-08-16 ENCOUNTER — Telehealth: Payer: Self-pay

## 2019-08-16 NOTE — Telephone Encounter (Signed)
Patient returned call and gave me a detailed account of his perception of how the office visit played out.  His account is vastly different than the report I have obtained. He states that physician was "shaking mad unable to type and I just wanted to tell him to calm down, its not like I have anthrax".    Patient has written a detailed, 5 page summary of his visit and what occurred, he accuses Dr. Para March of being belligerent and states, "I feared for myself, I was degraded, insulted and scared.  He belligerently lectured me."  "The whole exam was unlike anything I have ever experienced and this is serious."   In closing, I informed patient that I will investigate his listed concerns and review with the care team.  Patient tried to get me to make a response by asking me questions to validate, my only response was the following:  "I am sorry to hear this is your perception of the visit.  These are serious concerns and until I complete my investigation, I am not prepared to make a statement or comment."    Patient is aware that I will follow up with him next week. At this point, I have not discussed his dismissal as I need to investigate further before addressing and closing out this concern due to the egregiousness of the accusations made by patient.

## 2019-08-16 NOTE — Telephone Encounter (Signed)
Patient called our patient relations department and left a message on 08/15/19  stating:    Dr. Damita Dunnings was "rude" to him when the patient declined a covid vaccination and that he was rude because the patient didn't believe the vaccine was necessary. He stated that Dr. Damita Dunnings was forceful and that they had a "confrontation" outside the room, at which Dr. Damita Dunnings "verbally threw him out" saying that he could find another doctor if the patient felt that Dr. Damita Dunnings wasn't right for him.   I immediately investigated this with Dr. Damita Dunnings who reports that this is a completely inaccurate representation of what occurred. (Refer also to office visit encounter for 08/15/19 which also documents occurrence).   Patient, initially, refused to wear mask in the exam room.  When Dr. Damita Dunnings told him to put his mask on due to it being our policy, patient finally put mask on but then continued to argue with Dr. Damita Dunnings about that and the COVID pandemic in general during the entire encounter.  Dr. Damita Dunnings stated that he had to continue to try and re-direct patient back on task of completing the physical.  He did let the patient know that he was being unreasonable and that if he didn't trust his provider to give him sound, accurate advice then he should consider finding a provider that he did trust.    Patient continued to be belligerent and argumentative throughout the visit as Dr. Damita Dunnings worked through his physical, health maintenance advice and finished exam.  Dr. Damita Dunnings did complete the exam and as he met patient at the door with the AVS sheet in closure, patient was witnessed being loud, inappropriate and argumentative.  Allie Bossier, NP goes on record to state that she witnessed the whole encounter outside of the room and it was the patient who was being rude, verbally aggressive and inappropriate.  Dr. Damita Dunnings calmly gave him the AVS and directed him to the end of the counter and to take a left and to "Please Leave" per Allie Bossier,  NP.      In follow up with Dr. Damita Dunnings, he did not dismiss patient during the exam but did suggest he find a provider that he can trust.  Now, due to behaviors exhibited during and after exam, his inability to be rational and reasonable and his clear lack of trust and faith in Dr. Damita Dunnings and in our organization, Dr. Damita Dunnings finds it necessary to dismiss him from the practice.   I have left the patient a message to please call me back.  If he returns my call, I will discuss his concerns and discuss his dismissal for all [facility].

## 2019-08-18 NOTE — Assessment & Plan Note (Addendum)
Tetanus 2014  Shingles d/w pt.   PNA not due.  Flu shot encouraged. D/w pt. Refused by patient.  covid vaccine encouraged.  Refused by patient.   cologuard 2020.  Prostate cancer screening and PSA options (with potential risks and benefits of testing vs not testing) were discussed along with recent recs/guidelines. He declined testing PSA at this point.  [facility]  Living will d/w pt. Wife designated if patient were incapacitated.  Diet and exercise encouraged.  ===================== Following documentation concerns patient actions/behavior at office visit.  Office visit took place in the midst of Covid pandemic.  We have multiple safety measures in place and require masking throughout time in the clinic unless specifically directed otherwise.  All patients are informed of our policy. We have signs posted throughout the office about masking while in the office.  He wasn't wearing a mask when I opened the door to the exam room.  We have multiple signs in the same exam room, clearly visible to the patient, advising patients to wear a mask.  I told him to put his mask back on.  He did.    We talked about covid pandemic.  He doesn't believe that the [place] has had more than 20,000 deaths from covid.  According to CDC site today, the current [place] death count from COVID is 515,277.  We talked about that at the office visit.  He does not accept that CDC statistic.  He stated he doesn't believe that flu vaccination or covid vaccination are worthwhile or useful.  He does not accept that masking is useful in the Covid pandemic.  It is widely supported by available evidence that routine vaccination is an effective intervention for vaccine-preventable diseases.  I explained the point to the patient that if he doesn't believe me on that issue, then I wouldn't expect him to believe me on other topics either.  It's not that I wouldn't see him here in clinic but that I don't know  why he would choose to still come here for preventive care if he didn't believe the advice I gave him.  At no point did I refuse care to the patient.   When it became clear that he did not except basic data related to vaccination, masking protocols, or total deaths from Covid, I tried to complete the remainder of the visit for routine preventive care/the other problems listed in this note.  He would then periodically interrupt me and ask about other things related to Covid.    He asked about recent changes to masking protocols in the state of [place].  It is beyond the scope of a routine physical exam to discuss [place] state policy.  It is also counterproductive to attempt a reasonable debate regarding Covid policy when the patient does not accept the premise of basic data.  I rerouted the conversation back to routine physical care otherwise.  He interrupted with a separate issue later about clothing contamination related to covid.  For the same reason discussed in the previous paragraph (regarding reasonable debate), I rerouted the conversation back to routine physical care otherwise.  He was dismissive of evidence and argumentative recurrently through the encounter.  After I had addressed all of his stated health concerns and age-appropriate items that needed to be addressed at a routine physical, I opened the exam door and from the nearest printer handed him his after visit summary.  I closed the exam room door after he exited  the exam room and he was given the opportunity to leave.  He was advised to leave.  He did not immediately leave.  When he was in the hallway he told me he would "report you [me] to your superiors."  I told him he was free to leave the clinic.   The exam room stayed closed off after his visit until staff cleaned the room per protocol.  I maintained my routine positioning during the exam with my laptop/stool and maintained my routine exposure precautions with masking, goggles,  gloves, hand washing, etc.   I reported his behavior to staff at the clinic.

## 2019-08-18 NOTE — Assessment & Plan Note (Signed)
No ADE on med. Compliant.  No SI/HI.  He thought med helped.   No change in medication at this point.

## 2019-08-18 NOTE — Assessment & Plan Note (Signed)
No change in medication.  Encouraged to continue work on diet and exercise.  Labs discussed with patient.

## 2019-08-18 NOTE — Assessment & Plan Note (Signed)
GERD controlled with PPI use.   Continue as is.

## 2019-08-18 NOTE — Assessment & Plan Note (Signed)
Living will d/w pt.  Wife designated if patient were incapacitated.   ?

## 2019-08-20 NOTE — Telephone Encounter (Signed)
I called patient and stated the following:   I have received and processed your complaint and it is being addressed.  We are actively investigating and I am not at liberty to discuss those details. I am calling to discuss care options for your moving forward as clearly the provider patient relationship has been compromised.   I offered to assist patient with physician referral supports and offered numbers to connect with a new provider OUTSIDE of the [facility] practices.  Patient informed that we will provide him 30 days of care/medications etc to allow him time to connect with a new provider.   I asked if he had any other questions of me at this time and he says, "no" but then continues to ask will he receive an outcome of the investigation or "action steps".  I replied that I am not at liberty to discuss the outcome or details of the investigation with him and as he was not happy and asking for a number to escalate this, I gave him the number to patient experience to investigate further.   Patient states "this is not my only option, I have other options to address".  I wished patient well and we ended the phone call.   Dismissal letter outlining reason as compromised patient physician relationship due to lack of trust, will be written and processed to be mailed to patient.

## 2019-08-20 NOTE — Telephone Encounter (Signed)
(  please refer to initial investigation and comments on 08/16/19 made by Dr. Para March and Mayra Reel, NP).   Dr. Para March and Mayra Reel, NP (who witnessed the encounter with patient outside of the room) maintain their account of patient's inappropriate, unreasonable, argumentative behavior.  Stating that he was the one who could not move past the discussions around COVID and made the completion of the visit very difficult and confrontational.    Mayra Reel, NP (witnessing bystander) states the following about what she witnessed:    "I noticed the patient and Dr. Para March out in the hallways.  Voices were not raised but what caught my attention was the patient arguing with Dr. Para March.  Dr. Para March was calm and attempted numerous times to give him his AVS sheet and Dr. Para March stated, " here are your papers, you can leave now by going this way". Patient continued to "antagonizingly question and argue with Dr. Para March and finally Dr. Para March calmly but firmly told patient, I said leave!" Jae Dire continues to describe patient in the following way, " Patient's tone was not yelling but he was poking at Dr. Para March, being unreasonable. It was shocking to me that a grown man would act this way."

## 2019-08-20 NOTE — Progress Notes (Signed)
Following phone call to patient on 08/20/19 responding to his concerns and discussing his dismissal due to relationship breakdown, letter has been written and will be signed and mailed to patient within the next 2 business days.

## 2019-08-20 NOTE — Telephone Encounter (Signed)
When speaking with patient on 3/5, he first asked me if he was being recorded and if I was alone in the room. I informed him that I was not recording him and my door was shut and I was the only one in the room.    During my conversation with patient on 08/16/19 he mentioned the following as his primary concerns during his visit:  1.  Patient states he was wearing a mask until nurse finished with his check in and left the room.  At that time he felt it safe to remove his mask even though there was signage throughout the room stating that he must continue to keep it on until his provider tells him to remove.  Patient says at other offices and at the dentist they let him remove it and he thought Dr. Damita Dunnings would want it off so he could check his eyes, ears and throat as part of his exam.   Patient states that when Dr. Damita Dunnings came in and saw him without a mask, he became very angry and told him to put his mask on, which he did.  Patient states that Dr. Damita Dunnings wouldn't let it go and kept repeating, " now we are going to have to shut this room down and clean it, and I don't know what we are going to do about this room".  Patient states, "I just wanted to tell him, just calm down man, its not like I have anthrax!"   2.  Patient states Dr. Damita Dunnings continued to "lecture" him upset in a "frightening way" , "in his space and face with his knees nearly touching".  He states the doctor just wouldn't let it go.  He does state that Dr. Damita Dunnings did not touch him, only was verbally aggressive towards him.   3.  Patient states he began asking questions about COVID in general about what PPE the doctor had and why he wasn't wearing an N95.  He said Dr. Damita Dunnings was acting like this was upsetting to him and "at one point became so angry that he was visibly shaking and couldn't type on the keyboard".    4.  Patient states that Dr. Damita Dunnings asked him if he had gotten the COVID vaccine, to which, patient admits saying verbatim, "HELL  NO!" and that he began arguing statistics with the doctor and information from the [facility].  They then discussed the flu shot to which patient states he refused and stated that he feels like, "my body, my right". He goes on to state, "Dr. Damita Dunnings has been a good doctor to me but he always wants to shoot me up with a flu shot".    5.  At this point patient states that Dr. Damita Dunnings repeatedly began telling him "Get another doctor if you don't agree with me" and "if you don't want me as your doctor, leave!" Patient states he was just trying to have a conversation with the doctor but he was too angry. He states Dr. Damita Dunnings was so angry, "that he didn't even give me a good exam.  He didn't check my ankles, which he usually does, do an eye,ear,nose throat exam or check for a lump in my testicle."  6.  Patient describes questioning the doctor when he left the door open and stated, "well if you were worried about me contaminating the room , why would you leave the door open and expose the whole hallways?" to which, he said Dr. Immediately shut the door.  When  leaving patient said he states Dr. Para March continued to speak angry and inappropriate to him, "verbally throwing him out of the office".  Patient says there was a brown haired nurse in the hallway who witnessed everything as he was watching her out of the corner of his eye. "You can ask her, she was making a face, shocked at how Dr. Para March was talking to me, I know she heard it!" "He (Dr. Para March) told me twice to GET OUT! And he kept yelling GET OUT! Until I left.   In closing patient states the following, " I feared for myself, I was degraded, insulted and scared.  He belligerently lectured me.  The whole exam was unlike anything I have ever experienced, this is very serious.

## 2019-08-21 NOTE — Telephone Encounter (Signed)
I stand by my note and dispute claims of inappropriate behavior. I sat in my usual location on a stool with my workstation in the exam room, per routine. The patient did not mention any type of testicle mass or testicular complaint that would have directed the exam otherwise. I will defer otherwise.

## 2019-08-22 NOTE — Telephone Encounter (Addendum)
08/20/19 letter to patient not sent and re-written with only change to adjust from Dr. Para March to from [facility] at [facility] due to safety concerns. Re-printed and signed for sending.  Reviewed with provider and assistant director, Charline Bills, who is aware of incident and change.

## 2019-09-23 NOTE — Progress Notes (Unsigned)
On 09/20/2019, Dennis Lambert from Patient experience, reached out to request a new, edited version, of the dismissal letter be processed to address patient's concerns.  The request is that we remove the reason as patient denies stating a lack of trust in his provider or the organization.   I have edited the letter using the template that Dennis Lambert reviewed with Dennis Lambert and Dennis Lambert for approved use.   Letter forwarded to [facility] at the office of patient experience to send to patient with a closing statement from [facility].

## 2019-09-23 NOTE — Progress Notes (Signed)
Spoke with Vicie Mutters and Tommie Raymond again today to review addended letter to send to the patient.  This is final agreed upon dismissal letter approved by all parties, including Dr. Para March.   Letter sent to [facility] from patient experience to follow up with the patient.   Thanks to all involved.

## 2020-06-18 ENCOUNTER — Other Ambulatory Visit: Payer: Self-pay | Admitting: Family Medicine

## 2020-07-10 ENCOUNTER — Other Ambulatory Visit: Payer: Self-pay | Admitting: Internal Medicine

## 2020-07-10 ENCOUNTER — Other Ambulatory Visit (HOSPITAL_COMMUNITY): Payer: Self-pay | Admitting: Internal Medicine

## 2020-07-10 DIAGNOSIS — E782 Mixed hyperlipidemia: Secondary | ICD-10-CM | POA: Insufficient documentation

## 2020-07-10 DIAGNOSIS — R1032 Left lower quadrant pain: Secondary | ICD-10-CM

## 2020-07-14 ENCOUNTER — Other Ambulatory Visit: Payer: Self-pay | Admitting: Physician Assistant

## 2020-07-14 ENCOUNTER — Ambulatory Visit: Admission: RE | Admit: 2020-07-14 | Payer: Managed Care, Other (non HMO) | Source: Ambulatory Visit

## 2020-07-14 ENCOUNTER — Other Ambulatory Visit (HOSPITAL_COMMUNITY): Payer: Self-pay | Admitting: Physician Assistant

## 2020-07-14 DIAGNOSIS — R7989 Other specified abnormal findings of blood chemistry: Secondary | ICD-10-CM

## 2020-07-14 DIAGNOSIS — R1013 Epigastric pain: Secondary | ICD-10-CM

## 2020-07-15 ENCOUNTER — Other Ambulatory Visit: Payer: Self-pay

## 2020-07-15 ENCOUNTER — Other Ambulatory Visit: Payer: Self-pay | Admitting: Internal Medicine

## 2020-07-15 ENCOUNTER — Other Ambulatory Visit: Payer: Self-pay | Admitting: Physician Assistant

## 2020-07-15 ENCOUNTER — Ambulatory Visit
Admission: RE | Admit: 2020-07-15 | Discharge: 2020-07-15 | Disposition: A | Discharge status: Home / Self Care | Payer: Managed Care, Other (non HMO) | Source: Ambulatory Visit | Attending: Physician Assistant | Admitting: Physician Assistant

## 2020-07-15 DIAGNOSIS — R1032 Left lower quadrant pain: Secondary | ICD-10-CM | POA: Insufficient documentation

## 2020-07-15 DIAGNOSIS — R1013 Epigastric pain: Secondary | ICD-10-CM | POA: Diagnosis not present

## 2020-07-15 DIAGNOSIS — R7989 Other specified abnormal findings of blood chemistry: Secondary | ICD-10-CM

## 2020-07-15 IMAGING — CT CT ABD-PELV W/O CM
1 of 2 series · 15 of 32 positions shown, 19 images · non-contrast
Comparison: No priors.

CLINICAL DATA: 64-year-old male with history of epigastric pain for
1 week with constipation. Elevated liver function tests.

EXAM:
CT ABDOMEN AND PELVIS WITHOUT CONTRAST
TECHNIQUE: Multidetector CT imaging of the abdomen and pelvis was performed
following the standard protocol without IV contrast.

[Series 2: axial st · axial · 0.84mm/px · z∈[-1079,-569]mm · 15 of 112 slices shown, 19 images]
[im 5/112  soft-tissue]
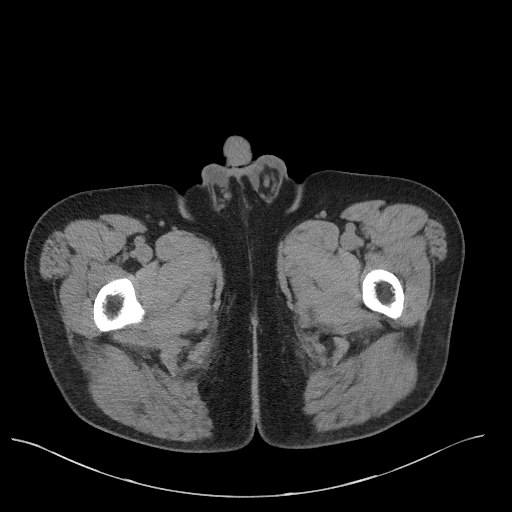
[im 5/112  bone]
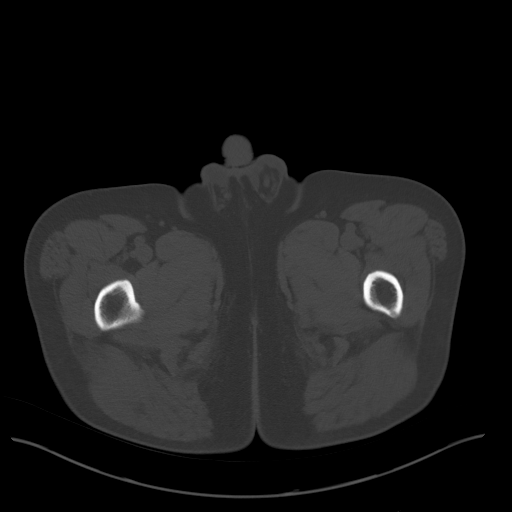
[im 15/112  soft-tissue]
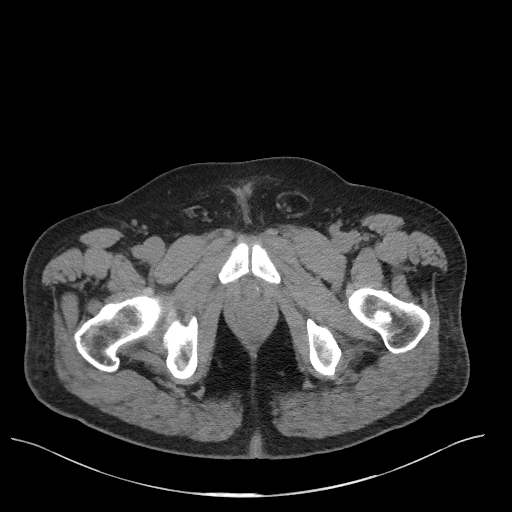
[im 25/112  soft-tissue]
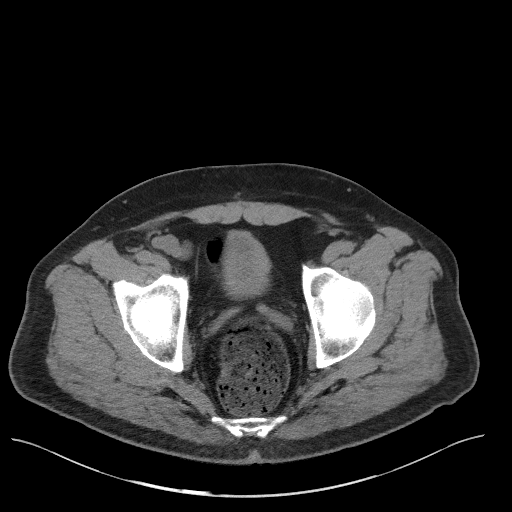
[im 29/112  soft-tissue]
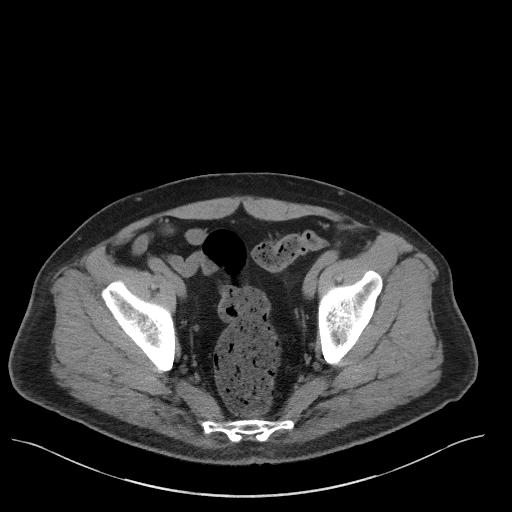
[im 39/112  soft-tissue]
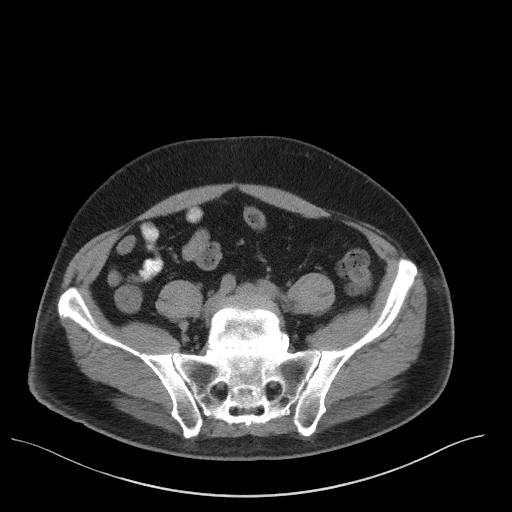
[im 49/112  soft-tissue]
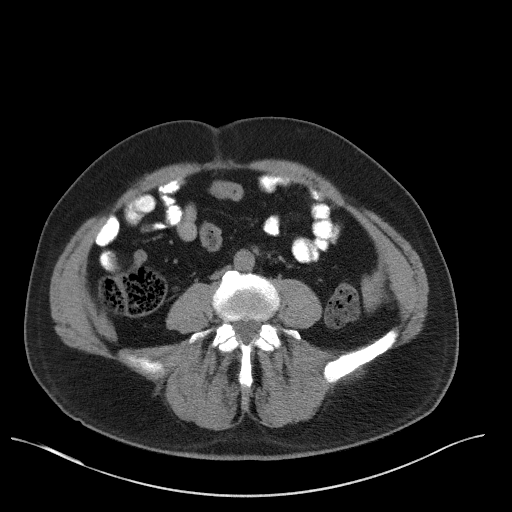
[im 58/112  soft-tissue]
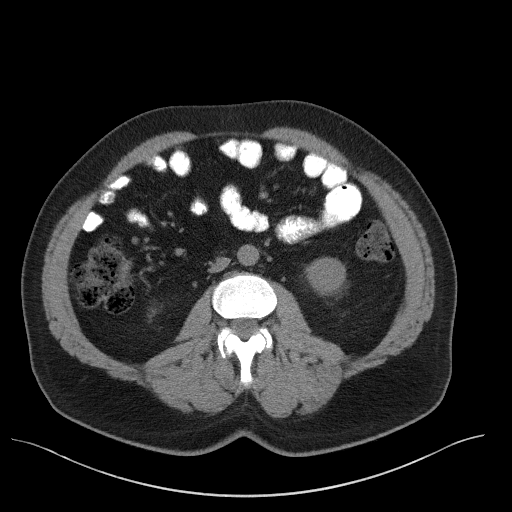
[im 63/112  soft-tissue]
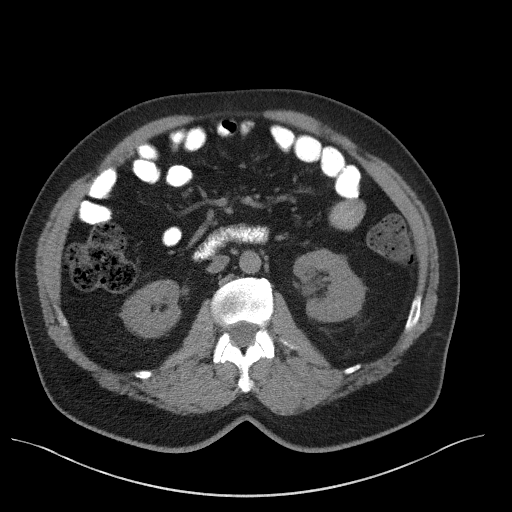
[im 73/112  soft-tissue]
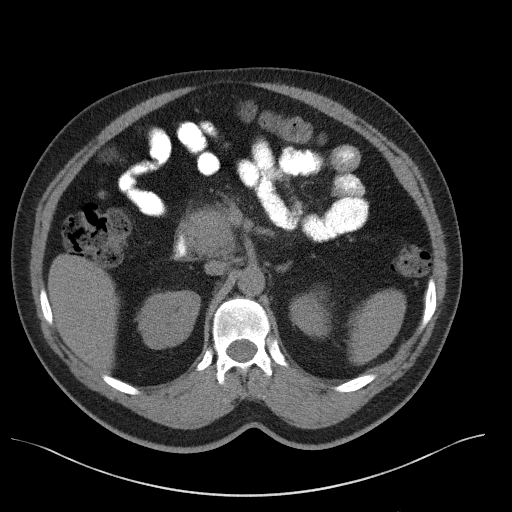
[im 73/112  bone]
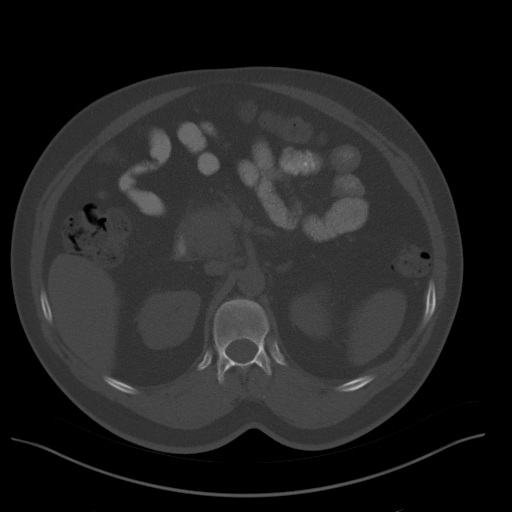
[im 83/112  soft-tissue]
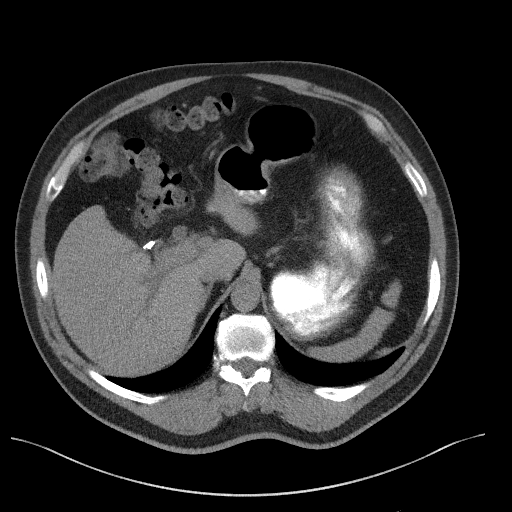
[im 87/112  soft-tissue]
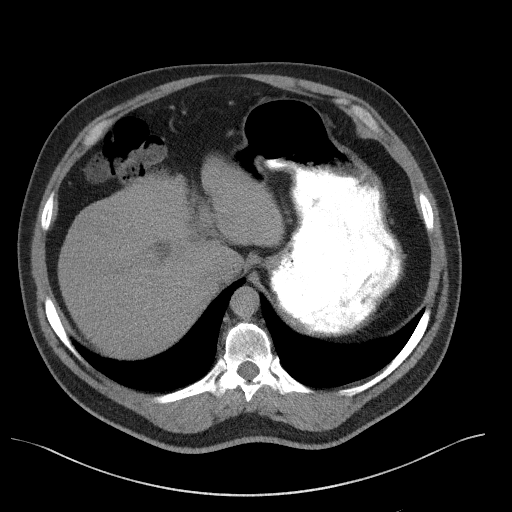
[im 92/112  lung]
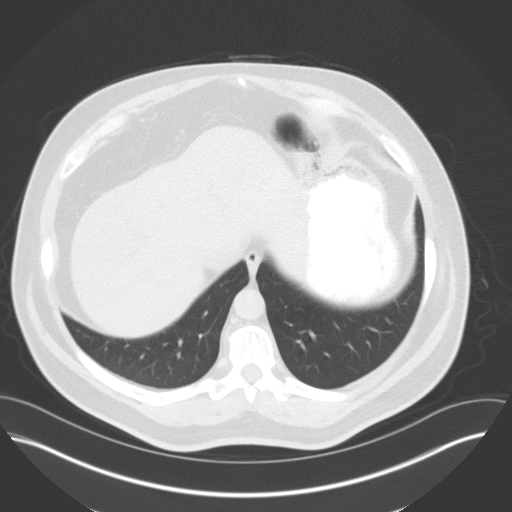
[im 97/112  soft-tissue]
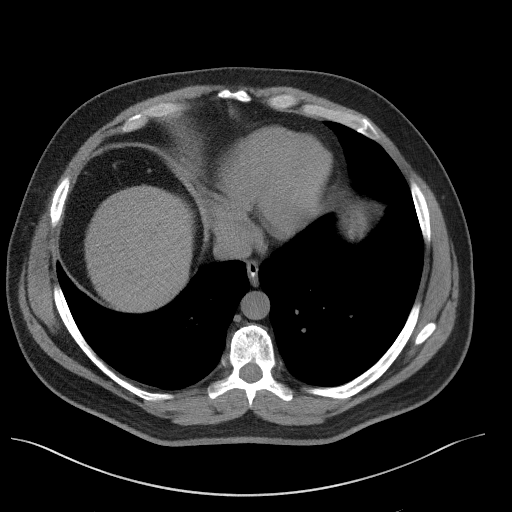
[im 97/112  lung]
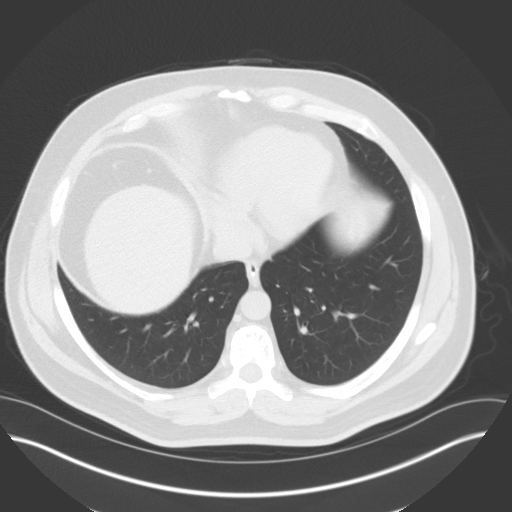
[im 102/112  lung]
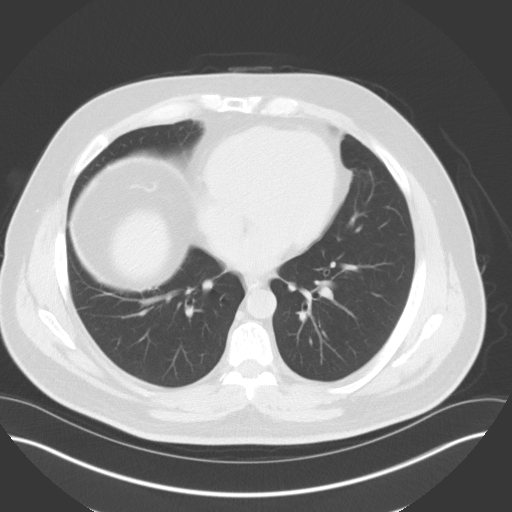
[im 107/112  soft-tissue]
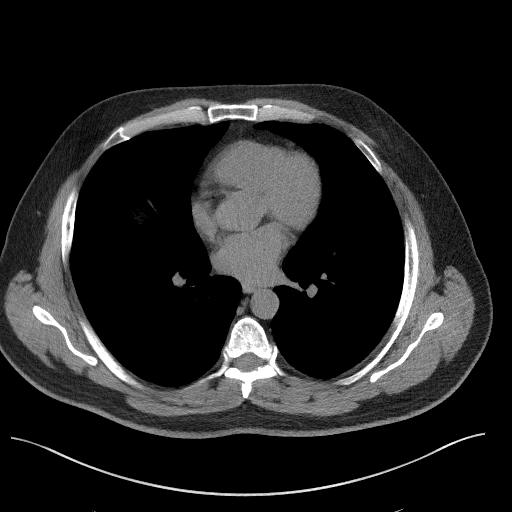
[im 107/112  lung]
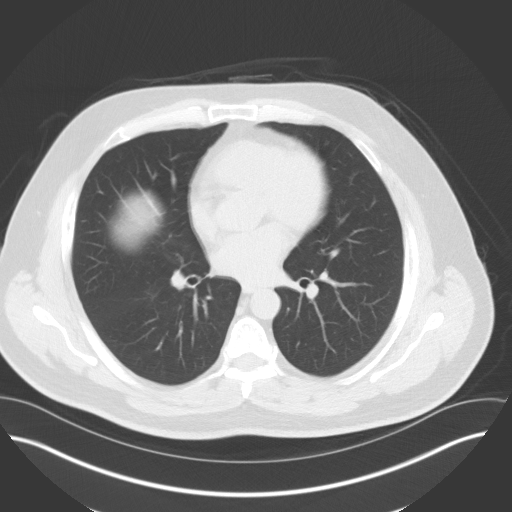

[15 of 32 positions shown; findings below may reference images not displayed]

FINDINGS: Lower chest: Unremarkable.

Hepatobiliary: No suspicious cystic or solid hepatic lesions are
confidently identified on today's noncontrast CT examination. Status
post cholecystectomy.

Pancreas: No definite pancreatic mass or peripancreatic fluid
collections. Diffuse haziness in the peripancreatic fat, concerning
for acute pancreatitis.

Spleen: Unremarkable.

Adrenals/Urinary Tract: 3 mm nonobstructive calculus in the upper
pole collecting system of the right kidney. No additional calculi
are noted within the left renal collecting system, along the course
of either ureter, or within the lumen of the urinary bladder. No
hydroureteronephrosis. Urinary bladder is normal in appearance.
Bilateral adrenal glands are normal in appearance.

Stomach/Bowel: The appearance of the stomach is normal. There is no
pathologic dilatation of small bowel or colon. Normal appendix.

Vascular/Lymphatic: No atherosclerotic calcifications in the
abdominal aorta or pelvic vasculature. No lymphadenopathy noted in
the abdomen or pelvis.

Reproductive: Prostate gland is either diminutive or surgically
absent. Seminal vesicles are unremarkable in appearance.

Other: No significant volume of ascites.  No pneumoperitoneum.

Musculoskeletal: Bilateral pars defects at L5 with 13 mm of
anterolisthesis of L5 upon S1. There are no aggressive appearing
lytic or blastic lesions noted in the visualized portions of the
skeleton.
IMPRESSION: 1. Peripancreatic inflammatory changes concerning for an acute
pancreatitis. Correlation with lipase levels is recommended.
2. 3 mm nonobstructive calculus in the upper pole collecting system
of the right kidney. No ureteral stones or findings of urinary tract
obstruction are noted at this time.
3. Grade 2 spondylolisthesis of L5 upon S1.

## 2020-07-21 ENCOUNTER — Telehealth: Payer: Self-pay

## 2020-07-21 ENCOUNTER — Other Ambulatory Visit: Payer: Self-pay | Admitting: Physician Assistant

## 2020-07-21 ENCOUNTER — Other Ambulatory Visit (HOSPITAL_COMMUNITY): Payer: Self-pay | Admitting: Physician Assistant

## 2020-07-21 DIAGNOSIS — R7989 Other specified abnormal findings of blood chemistry: Secondary | ICD-10-CM

## 2020-07-21 DIAGNOSIS — K851 Biliary acute pancreatitis without necrosis or infection: Secondary | ICD-10-CM

## 2020-07-21 NOTE — Telephone Encounter (Signed)
Copied from CRM [id number]. Topic: General - Other >> Jul 21, 2020  1:51 PM Randol Kern wrote: Reason for CRM: Pt would like to speak to Dr. Sullivan Lone about something that another provider has discussed with him. He is very concerned and states that he is a long time friend of Dr. Sullivan Lone and just wants a call back to talk.   Best contact: [phone]

## 2020-07-22 ENCOUNTER — Other Ambulatory Visit: Payer: Self-pay | Admitting: Physician Assistant

## 2020-07-22 ENCOUNTER — Other Ambulatory Visit: Payer: Self-pay

## 2020-07-22 ENCOUNTER — Ambulatory Visit
Admission: RE | Admit: 2020-07-22 | Discharge: 2020-07-22 | Disposition: A | Discharge status: Home / Self Care | Payer: Managed Care, Other (non HMO) | Source: Ambulatory Visit | Attending: Physician Assistant | Admitting: Physician Assistant

## 2020-07-22 DIAGNOSIS — R7989 Other specified abnormal findings of blood chemistry: Secondary | ICD-10-CM | POA: Diagnosis present

## 2020-07-22 DIAGNOSIS — K851 Biliary acute pancreatitis without necrosis or infection: Secondary | ICD-10-CM

## 2020-07-22 IMAGING — MR MR ABDOMEN WO/W CM MRCP
19 of 22 series · 43 of 48 positions shown · IV contrast (gadavist)
Comparison: CT scan [DATE]

CLINICAL DATA: Elevated bilirubin.

EXAM:
MRI ABDOMEN WITHOUT AND WITH CONTRAST (INCLUDING MRCP)
TECHNIQUE: Multiplanar multisequence MR imaging of the abdomen was performed
both before and after the administration of intravenous contrast.
Heavily T2-weighted images of the biliary and pancreatic ducts were
obtained, and three-dimensional MRCP images were rendered by post
processing.
CONTRAST:  8mL GADAVIST GADOBUTROL 1 MMOL/ML IV SOLN

[Series 3: T2 · coronal · 6.5mm · 1.25mm/px · 2 of 34 slices shown (1 of 2)]
[im 1/34]
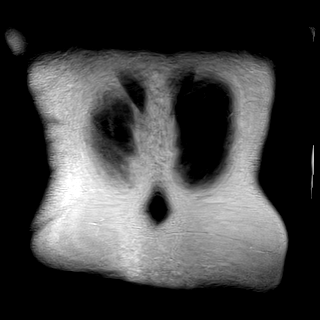
[im 34/34]
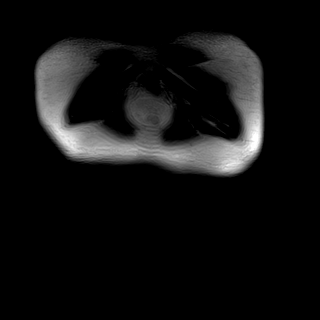

[Series 4: T2 · axial · 6.5mm · 1.25mm/px · 1 of 35 slices shown (2 of 2)]
[im 1/35]
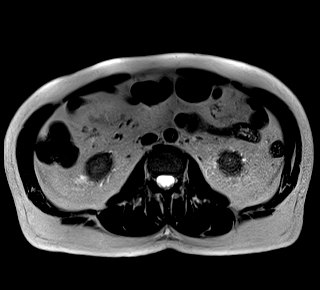

[Series 5: T1 · axial · 6.5mm · 0.74mm/px · 1 of 35 slices shown (1 of 2)]
[im 1/35]
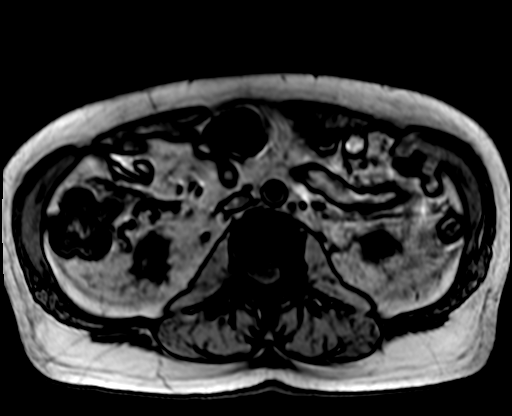

[Series 5: T1 · axial · 6.5mm · 0.74mm/px · 1 of 35 slices shown (2 of 2)]
[im 1/35]
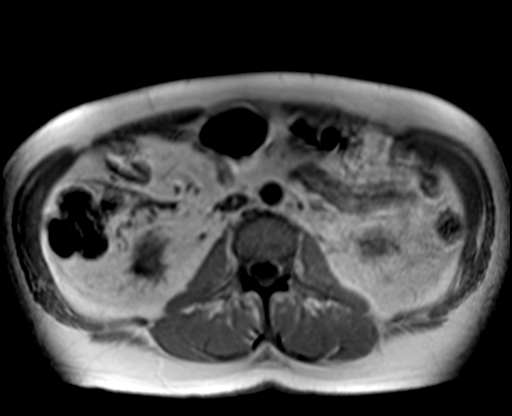

[Series 8: T2 fat-sat · axial · 6.5mm · 1.25mm/px · 1 of 35 slices shown]
[im 1/35]
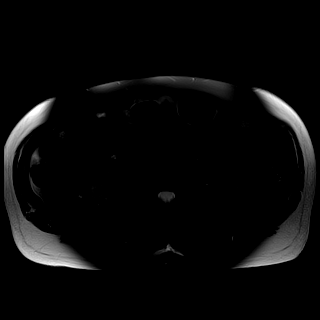

[Series 9: ax dwi_tracew · axial · 6.5mm · 1.42mm/px · z∈[-33,+240]mm · 4 of 108 slices shown]
[im 1/108]
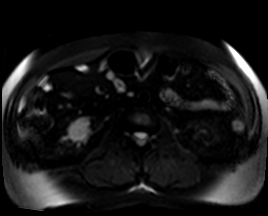
[im 36/108]
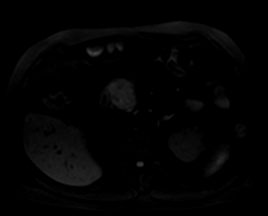
[im 72/108]
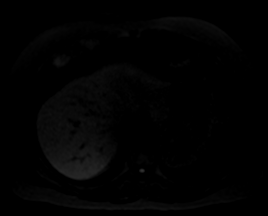
[im 108/108]
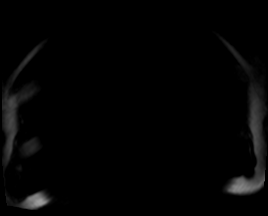

[Series 10: ax dwi_adc · axial · 6.5mm · 1.42mm/px · 1 of 36 slices shown]
[im 1/36]
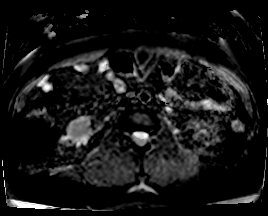

[Series 14: MRCP · coronal · 3.0mm · 1.25mm/px · 1 of 17 slices shown]
[im 1/17]
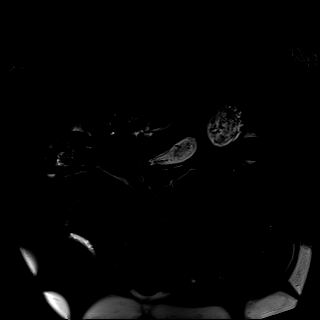

[Series 15: radials · oblique · 50.0mm · 0.78mm/px · 1 of 5 slices shown]
[im 1/5]
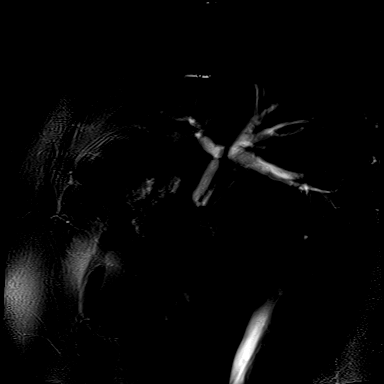

[Series 16: T1 dynamic fat-sat · axial · non-contrast · 3.5mm · 1.25mm/px · z∈[-37,+240]mm · 3 of 80 slices shown (1 of 5)]
[im 1/80]
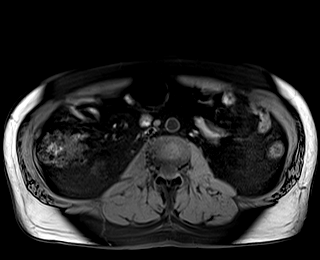
[im 40/80]
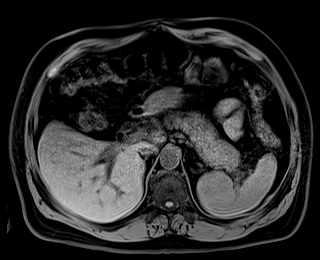
[im 80/80]
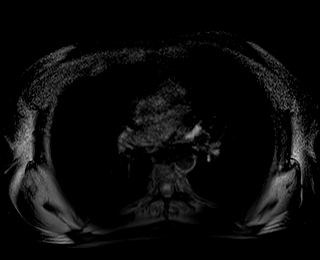

[Series 17: T1 dynamic fat-sat post-contrast · axial · 3.5mm · 1.25mm/px · z∈[-37,+240]mm · 3 of 80 slices shown (1 of 4)]
[im 1/80]
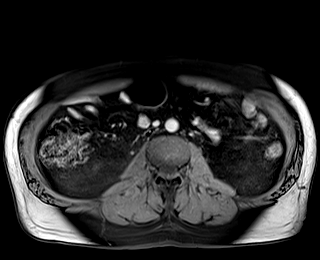
[im 40/80]
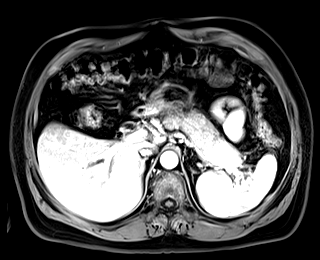
[im 80/80]
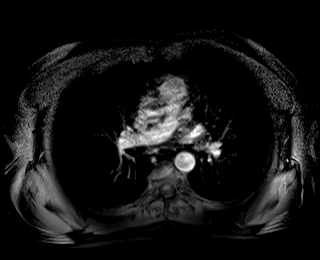

[Series 18: T1 dynamic fat-sat · axial · 3.5mm · 1.25mm/px · z∈[-37,+240]mm · 3 of 80 slices shown (2 of 5)]
[im 1/80]
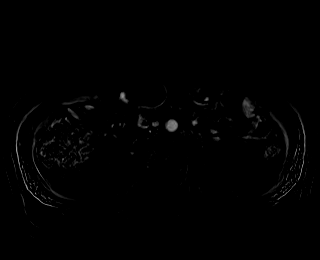
[im 40/80]
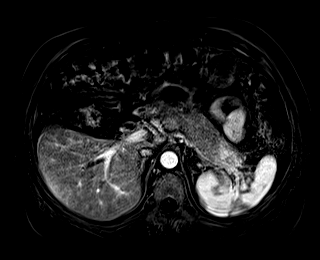
[im 80/80]
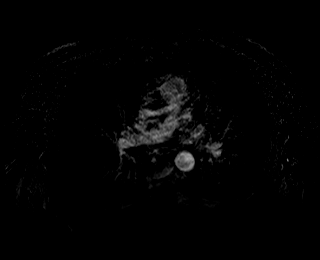

[Series 19: T1 dynamic fat-sat post-contrast · axial · 3.5mm · 1.25mm/px · z∈[-37,+240]mm · 3 of 80 slices shown (2 of 4)]
[im 1/80]
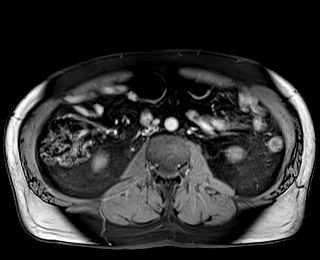
[im 40/80]
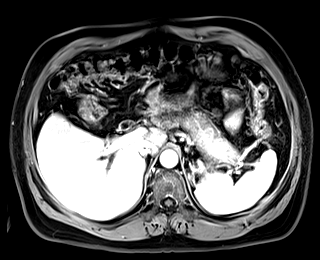
[im 80/80]
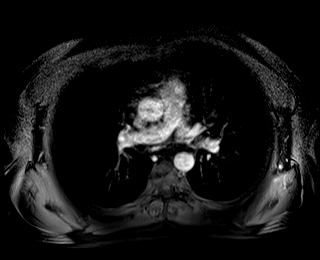

[Series 20: T1 dynamic fat-sat · axial · 3.5mm · 1.25mm/px · z∈[-37,+240]mm · 3 of 80 slices shown (3 of 5)]
[im 1/80]
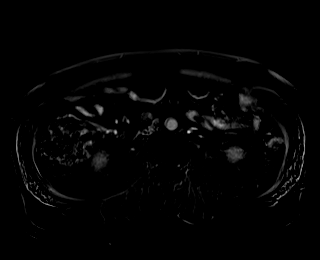
[im 40/80]
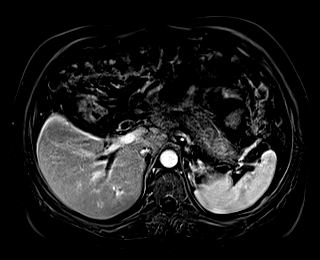
[im 80/80]
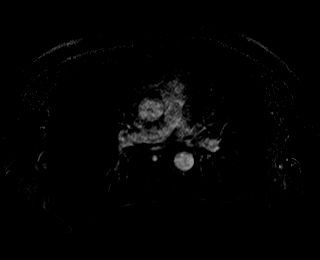

[Series 21: T1 dynamic fat-sat post-contrast · axial · 3.5mm · 1.25mm/px · z∈[-37,+240]mm · 3 of 80 slices shown (3 of 4)]
[im 1/80]
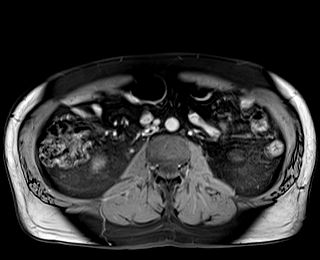
[im 40/80]
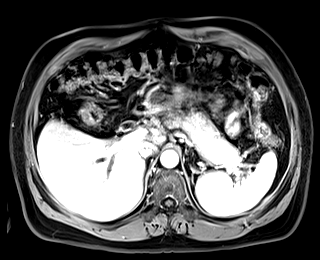
[im 80/80]
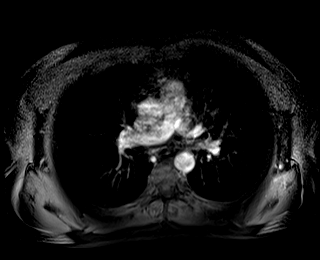

[Series 22: T1 dynamic fat-sat · axial · 3.5mm · 1.25mm/px · z∈[-37,+240]mm · 3 of 80 slices shown (4 of 5)]
[im 1/80]
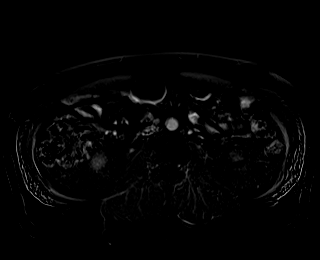
[im 40/80]
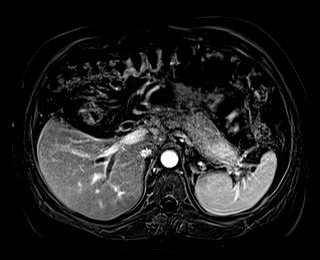
[im 80/80]
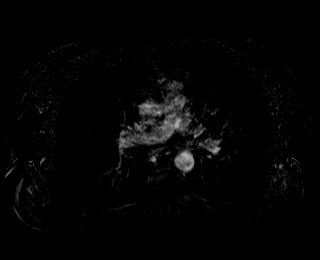

[Series 23: T1 dynamic post-contrast · coronal · 3.5mm · 1.31mm/px · 3 of 80 slices shown]
[im 1/80]
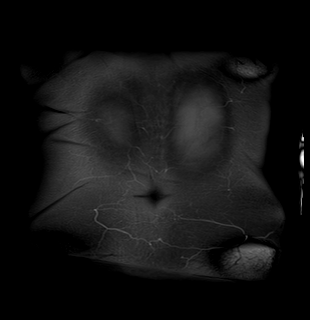
[im 40/80]
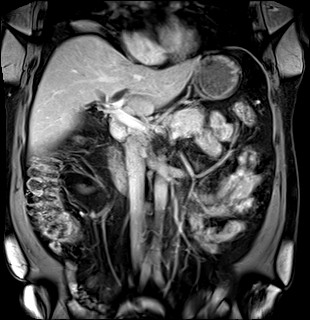
[im 80/80]
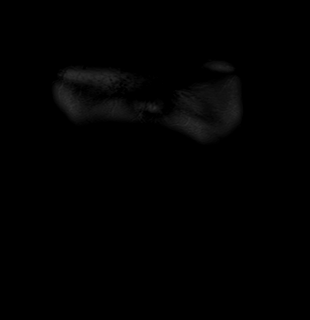

[Series 24: T1 dynamic fat-sat post-contrast · axial · 3.5mm · 1.25mm/px · z∈[-37,+240]mm · 3 of 80 slices shown (4 of 4)]
[im 1/80]
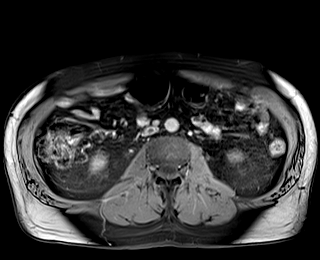
[im 40/80]
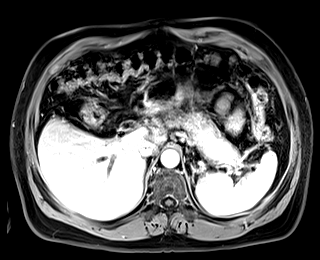
[im 80/80]
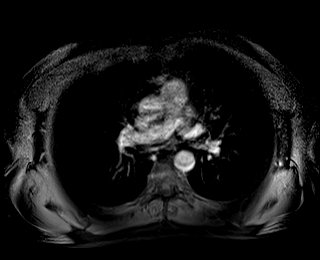

[Series 25: T1 dynamic fat-sat · axial · 3.5mm · 1.25mm/px · z∈[-37,+240]mm · 3 of 80 slices shown (5 of 5)]
[im 1/80]
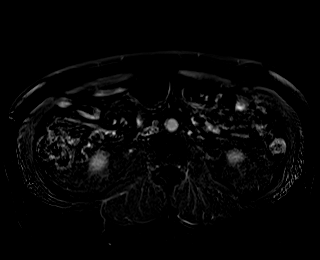
[im 40/80]
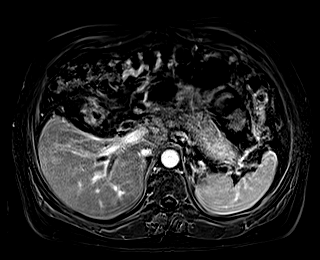
[im 80/80]
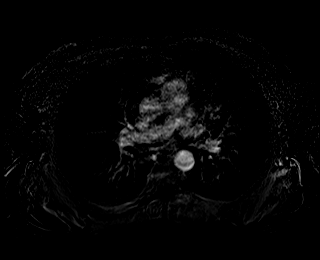

[43 of 48 positions shown; findings below may reference images not displayed]

FINDINGS: Lower chest: Unremarkable.

Hepatobiliary: Intra and extrahepatic biliary duct dilatation noted.
The intrahepatic biliary duct dilatation is at the level of the left
and right hepatic duct confluence. Postcontrast imaging shows some
enhancement at the level of the left and right ductal cut offs with
some possible ductal wall thickening but no discrete or measurable
mass lesion. This finding is well demonstrated on 3 minutes
postcontrast delayed axial image 35 of series 24. There is a focal
short segment stricture in the posterior left hepatic ducts which
continue on into the common common duct.

Common bile duct measures 11 mm diameter and is abruptly stenotic in
the pancreatic head with a subtle hypointense wall on T2 imaging.
Gallbladder is surgically absent.

Pancreas: Pancreas appears diffusely thickened and ill-defined,
similar to recent CT. There is not a substantial amount of
peripancreatic edema although a subtle peripheral rim of T2
hypointensity is seen most prominently in the region of the
pancreatic tail with there is some obscuration of the main
pancreatic duct. The main pancreatic duct is nondilated but
terminates abruptly in the head of pancreas. Early postcontrast
imaging shows a small 6 mm nodular focus of hyperenhancement in the
head of pancreas, in the region of the ampulla (see postcontrast 23
second image 56 of series 17 with subtraction image of the same
series [DATE]).

Pancreatic head has a nodular, masslike contour with mildly
heterogeneous attenuation and heterogeneous enhancement but no
restricted diffusion. Again there is no associated dilatation of the
main pancreatic duct.

Spleen:  No splenomegaly. No focal mass lesion.

Adrenals/Urinary Tract: No adrenal nodule or mass. Central sinus
cysts noted left kidney. Kidneys otherwise unremarkable.

Stomach/Bowel: Stomach is unremarkable. No gastric wall thickening.
No evidence of outlet obstruction. Duodenum is normally positioned
as is the ligament of Treitz. No small bowel or colonic dilatation
within the visualized abdomen.

Vascular/Lymphatic: No abdominal aortic aneurysm. 18 mm
hepatoduodenal ligament lymph node is enlarged. No other abdominal
lymphadenopathy evident.

Other:  No intraperitoneal free fluid.

Musculoskeletal: No focal suspicious marrow enhancement within the
visualized bony anatomy.
IMPRESSION: 1. Intra and extrahepatic biliary duct dilatation with abrupt cut
off of both the left and right hepatic ducts at the level of
confluence. There is some possible ductal wall thickening and
hyperenhancement in the region of the stricture, but no measurable
mass lesion. There is also a focal stricture in the posterior left
hepatic ducts and another area of abrupt stricture of the common
bile duct in the head of pancreas. These findings are associated
with a pancreas that appears diffusely thickened and areas of
heterogeneous parenchyma and heterogeneous enhancement (most notably
in the tail where there is an apparent T2 hypointense rim on MR
today). There is variable, segmental narrowing/stricture of the main
pancreatic duct without associated dilated segments. Overall,
imaging features are suspicious for autoimmune pancreatitis.
Endoscopic ultrasound recommended to further evaluate and
definitively exclude pancreatic head mass given the nodular masslike
configuration of the pancreatic head and associated small nodular
focus of hyperenhancement in the head of pancreas adjacent to or at
the ampulla. Given the pancreatic involvement, primary sclerosing
cholangitis is considered less likely.
2. 18 mm hepatoduodenal ligament lymph node is enlarged.

## 2020-07-22 MED ORDER — GADOBUTROL 1 MMOL/ML IV SOLN
8.0000 mL | Freq: Once | INTRAVENOUS | Status: AC | PRN
  Administered 2020-07-22: 8 mL via INTRAVENOUS

## 2020-07-30 ENCOUNTER — Other Ambulatory Visit: Payer: Self-pay

## 2020-07-31 ENCOUNTER — Other Ambulatory Visit: Payer: Self-pay

## 2020-07-31 ENCOUNTER — Encounter: Payer: Self-pay | Admitting: Gastroenterology

## 2020-07-31 ENCOUNTER — Ambulatory Visit (INDEPENDENT_AMBULATORY_CARE_PROVIDER_SITE_OTHER): Payer: Self-pay | Admitting: Gastroenterology

## 2020-07-31 VITALS — BP 119/73 | HR 84 | Temp 98.0°F | Ht 70.0 in | Wt 210.1 lb

## 2020-07-31 DIAGNOSIS — K831 Obstruction of bile duct: Secondary | ICD-10-CM

## 2020-07-31 DIAGNOSIS — K861 Other chronic pancreatitis: Secondary | ICD-10-CM

## 2020-07-31 NOTE — Progress Notes (Signed)
Arlyss Repress, MD [address]  [address]  [place], [place] [address]  Main: [phone]  Fax: [phone]    Gastroenterology Consultation  Referring Provider:     Marguarite Arbour, MD Primary Care Physician:  Marguarite Arbour, MD Primary Gastroenterologist:  Dr. Arlyss Repress Reason for Consultation:     Jaundice        HPI:   Dennis Lambert is a 65 y.o. male referred by Dr. Judithann Sheen, Duane Lope, MD  for consultation & management of jaundice.  Patient reports that in end of January, he had mild discomfort in his epigastric area, radiating to his back, he felt it was secondary to his constipation.  Patient went for physical on 1/28, labs revealed abnormal LFTs, AST 71, ALT 65, alkaline phosphatase 127, total bilirubin 0.4, CBC was normal.  Repeat labs on 2/1 revealed elevated lipase and worsening of LFTs.  Subsequently, patient underwent CT abdomen and pelvis which revealed diffuse haziness in the peripancreatic fat concerning for acute pancreatitis.  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  There is also an abrupt stricture of the CBD in head of the pancreas.  The pancreas appeared diffusely thickened and heterogeneous parenchyma, heterogeneous enhancement as well as segmental narrowing/stricture of the main pancreatic duct without associated dilated segment, these features suspicious for autoimmune pancreatitis.  There is 18 mm hepatoduodenal ligament lymph node.  There is also nodular masslike configuration of the pancreatic head.  Patient said, over the last 1 year, he has been trying to lose weight, lost about 20 pounds intentionally.  He denies any abdominal pain, nausea or vomiting.  He does have severe yellowing of his skin and eyes.  He does report some decreased appetite, watchful of his diet as well, having small portion meal  only.  He denies fever, chills.  Patient takes aspirin 325 mg, self medicate for primary prevention      NSAIDs: Aspirin 325 mg daily  Antiplts/Anticoagulants/Anti thrombotics: Aspirin 300 mg daily  GI Procedures: S/p cholecystectomy several years ago Unknown if patient had a colonoscopy Patient denies family history of pancreatic cancer, colon cancer  Past Medical History:  Diagnosis Date  . Dysthymic disorder    and mood swings controlled on zoloft  . GERD (gastroesophageal reflux disease)   . Hyperlipidemia   . Hypertension   . Renal stone     Past Surgical History:  Procedure Laterality Date  . CHOLECYSTECTOMY  04/28/01   Daphine Deutscher)  . Hepatobiliary scan  04/17/01   +  . UPPER GASTROINTESTINAL ENDOSCOPY  ~94   Reflux    Current Outpatient Medications:  .  aspirin 325 MG tablet, Take 325 mg by mouth daily., Disp: , Rfl:  .  b complex vitamins tablet, Take 1 tablet by mouth daily., Disp: , Rfl:  .  lansoprazole (PREVACID) 30 MG capsule, Take by mouth., Disp: , Rfl:  .  sertraline (ZOLOFT) 100 MG tablet, TAKE 1 TABLET BY MOUTH  DAILY, Disp: 90 tablet, Rfl: 0 .  HYDROcodone-acetaminophen (NORCO/VICODIN) 5-325 MG tablet, Take 1 tablet by mouth every 6 (six) hours as needed for moderate pain or severe pain (for renal stones). (Patient not taking: No sig reported), Disp: 15 tablet, Rfl: 0 .  simvastatin (ZOCOR) 40 MG tablet, TAKE 1 TABLET BY MOUTH AT  BEDTIME (Patient not taking: Reported on 07/31/2020), Disp: 90 tablet, Rfl: 0  Family History  Problem Relation Age of Onset  . Hypertension Mother   . Heart disease Mother        CHF  . Kidney disease Mother        Renal disease  . Cancer Mother        lung cancer  . Fibromyalgia Sister   . COPD Father   . Heart disease Other   . Hypertension Other   . Stroke Other   . Depression Neg Hx   . Alcohol abuse Neg Hx   . Drug abuse Neg Hx   . Colon cancer Neg Hx   . Prostate cancer Neg Hx      Social History    Tobacco Use  . Smoking status: Never Smoker  . Smokeless tobacco: Never Used  Substance Use Topics  . Alcohol use: No  . Drug use: No    Allergies as of 07/31/2020 - Review Complete 07/31/2020  Allergen Reaction Noted  . Amoxicillin    . Clarithromycin    . Codeine sulfate    . Flomax [tamsulosin] Other (See Comments) 11/27/2018  . Iodine Other (See Comments) 07/10/2020  . Shellfish allergy  10/11/2011    Review of Systems:    All systems reviewed and negative except where noted in HPI.   Physical Exam:  BP 119/73 (BP Location: Left Arm, Patient Position: Sitting, Cuff Size: Normal)   Pulse 84   Temp 98 F (36.7 C) (Oral)   Ht 5\' 10"  (1.778 m)   Wt 210 lb 2 oz (95.3 kg)   BMI 30.15 kg/m  No LMP for male patient.  General:   Alert,  Well-developed, well-nourished, pleasant and cooperative in NAD Head:  Normocephalic and atraumatic. Eyes: deep icterus.   Conjunctiva pink. Ears:  Normal auditory acuity. Nose:  No deformity, discharge, or lesions. Mouth:  No deformity or lesions,oropharynx pink & moist. Neck:  Supple; no masses or thyromegaly. Lungs:  Respirations even and unlabored.  Clear throughout to auscultation.   No wheezes, crackles, or rhonchi. No acute distress. Heart:  Regular rate and rhythm; no murmurs, clicks, rubs, or gallops. Abdomen:  Normal bowel sounds. Soft, non-tender and non-distended without masses, hepatosplenomegaly or hernias noted.  No guarding or rebound tenderness.   Rectal: Not performed Msk:  Symmetrical without gross deformities. Good, equal movement & strength bilaterally. Pulses:  Normal pulses noted. Extremities:  No clubbing or edema.  No cyanosis. Neurologic:  Alert and oriented x3;  grossly normal neurologically. Skin:  Intact without significant lesions or rashes. No jaundice. Psych:  Alert and cooperative. Normal mood and affect.  Imaging Studies: Reviewed  Assessment and Plan:   LINVILLE DECAROLIS is a 65 y.o. 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 Saturday Check ANA, IgG4 levels, if elevated, will start him empirically on prednisone 40 mg daily Patient will be also referred for endoscopic ultrasound of the pancreas for tissue diagnosis to rule out malignancy   Follow up in 4 to 6 weeks   Servando Snare, MD

## 2020-07-31 NOTE — H&P (View-Only) (Signed)
 Marshe Shrestha R Ninah Moccio, MD [address]  [address]  [place], [place] [address]  Main: [phone]  Fax: [phone]    Gastroenterology Consultation  Referring Provider:     Sparks, Jeffrey D, MD Primary Care Physician:  Sparks, Jeffrey D, MD Primary Gastroenterologist:  Dr. Antania Hoefling R Tryniti Laatsch Reason for Consultation:     Jaundice        HPI:   Dennis Lambert is a 64 y.o. male referred by Dr. Sparks, Jeffrey D, MD  for consultation & management of jaundice.  Patient reports that in end of January, he had mild discomfort in his epigastric area, radiating to his back, he felt it was secondary to his constipation.  Patient went for physical on 1/28, labs revealed abnormal LFTs, AST 71, ALT 65, alkaline phosphatase 127, total bilirubin 0.4, CBC was normal.  Repeat labs on 2/1 revealed elevated lipase and worsening of LFTs.  Subsequently, patient underwent CT abdomen and pelvis which revealed diffuse haziness in the peripancreatic fat concerning for acute pancreatitis.  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  There is also an abrupt stricture of the CBD in head of the pancreas.  The pancreas appeared diffusely thickened and heterogeneous parenchyma, heterogeneous enhancement as well as segmental narrowing/stricture of the main pancreatic duct without associated dilated segment, these features suspicious for autoimmune pancreatitis.  There is 18 mm hepatoduodenal ligament lymph node.  There is also nodular masslike configuration of the pancreatic head.  Patient said, over the last 1 year, he has been trying to lose weight, lost about 20 pounds intentionally.  He denies any abdominal pain, nausea or vomiting.  He does have severe yellowing of his skin and eyes.  He does report some decreased appetite, watchful of his diet as well, having small portion meal  only.  He denies fever, chills.  Patient takes aspirin 325 mg, self medicate for primary prevention      NSAIDs: Aspirin 325 mg daily  Antiplts/Anticoagulants/Anti thrombotics: Aspirin 300 mg daily  GI Procedures: S/p cholecystectomy several years ago Unknown if patient had a colonoscopy Patient denies family history of pancreatic cancer, colon cancer  Past Medical History:  Diagnosis Date  . Dysthymic disorder    and mood swings controlled on zoloft  . GERD (gastroesophageal reflux disease)   . Hyperlipidemia   . Hypertension   . Renal stone     Past Surgical History:  Procedure Laterality Date  . CHOLECYSTECTOMY  04/28/01   ([place])  . Hepatobiliary scan  04/17/01   +  . UPPER GASTROINTESTINAL ENDOSCOPY  ~94   Reflux    Current Outpatient Medications:  .  aspirin 325 MG tablet, Take 325 mg by mouth daily., Disp: , Rfl:  .  b complex vitamins tablet, Take 1 tablet by mouth daily., Disp: , Rfl:  .  lansoprazole (PREVACID) 30 MG capsule, Take by mouth., Disp: , Rfl:  .  sertraline (ZOLOFT) 100 MG tablet, TAKE 1 TABLET BY MOUTH  DAILY, Disp: 90 tablet, Rfl: 0 .  HYDROcodone-acetaminophen (NORCO/VICODIN) 5-325 MG tablet, Take 1 tablet by mouth every 6 (six) hours as needed for moderate pain or severe pain (for renal stones). (Patient not taking: No sig reported), Disp: 15 tablet, Rfl: 0 .  simvastatin (ZOCOR) 40 MG tablet, TAKE 1 TABLET BY MOUTH AT  BEDTIME (Patient not taking: Reported on 07/31/2020), Disp: 90 tablet, Rfl: 0     Family History  Problem Relation Age of Onset  . Hypertension Mother   . Heart disease Mother        CHF  . Kidney disease Mother        Renal disease  . Cancer Mother        lung cancer  . Fibromyalgia Sister   . COPD Father   . Heart disease Other   . Hypertension Other   . Stroke Other   . Depression Neg Hx   . Alcohol abuse Neg Hx   . Drug abuse Neg Hx   . Colon cancer Neg Hx   . Prostate cancer Neg Hx      Social History    Tobacco Use  . Smoking status: Never Smoker  . Smokeless tobacco: Never Used  Substance Use Topics  . Alcohol use: No  . Drug use: No    Allergies as of 07/31/2020 - Review Complete 07/31/2020  Allergen Reaction Noted  . Amoxicillin    . Clarithromycin    . Codeine sulfate    . Flomax [tamsulosin] Other (See Comments) 11/27/2018  . Iodine Other (See Comments) 07/10/2020  . Shellfish allergy  10/11/2011    Review of Systems:    All systems reviewed and negative except where noted in HPI.   Physical Exam:  BP 119/73 (BP Location: Left Arm, Patient Position: Sitting, Cuff Size: Normal)   Pulse 84   Temp 98 F (36.7 C) (Oral)   Ht 5\' 10"  (1.778 m)   Wt 210 lb 2 oz (95.3 kg)   BMI 30.15 kg/m  No LMP for male patient.  General:   Alert,  Well-developed, well-nourished, pleasant and cooperative in NAD Head:  Normocephalic and atraumatic. Eyes: deep icterus.   Conjunctiva pink. Ears:  Normal auditory acuity. Nose:  No deformity, discharge, or lesions. Mouth:  No deformity or lesions,oropharynx pink & moist. Neck:  Supple; no masses or thyromegaly. Lungs:  Respirations even and unlabored.  Clear throughout to auscultation.   No wheezes, crackles, or rhonchi. No acute distress. Heart:  Regular rate and rhythm; no murmurs, clicks, rubs, or gallops. Abdomen:  Normal bowel sounds. Soft, non-tender and non-distended without masses, hepatosplenomegaly or hernias noted.  No guarding or rebound tenderness.   Rectal: Not performed Msk:  Symmetrical without gross deformities. Good, equal movement & strength bilaterally. Pulses:  Normal pulses noted. Extremities:  No clubbing or edema.  No cyanosis. Neurologic:  Alert and oriented x3;  grossly normal neurologically. Skin:  Intact without significant lesions or rashes. No jaundice. Psych:  Alert and cooperative. Normal mood and affect.  Imaging Studies: Reviewed  Assessment and Plan:   Dennis Lambert is a 65 y.o. 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 Saturday Check ANA, IgG4 levels, if elevated, will start him empirically on prednisone 40 mg daily Patient will be also referred for endoscopic ultrasound of the pancreas for tissue diagnosis to rule out malignancy   Follow up in 4 to 6 weeks   Servando Snare, MD

## 2020-08-01 LAB — ANA: Anti Nuclear Antibody (ANA): NEGATIVE

## 2020-08-02 LAB — IGG 4: IgG, Subclass 4: 230 mg/dL — ABNORMAL HIGH (ref 2–96)

## 2020-08-03 ENCOUNTER — Encounter: Payer: Self-pay | Admitting: Gastroenterology

## 2020-08-03 ENCOUNTER — Other Ambulatory Visit: Payer: Self-pay

## 2020-08-03 ENCOUNTER — Other Ambulatory Visit
Admission: RE | Admit: 2020-08-03 | Discharge: 2020-08-03 | Disposition: A | Discharge status: Home / Self Care | Payer: Managed Care, Other (non HMO) | Source: Ambulatory Visit | Attending: Gastroenterology | Admitting: Gastroenterology

## 2020-08-03 ENCOUNTER — Other Ambulatory Visit: Payer: Self-pay | Admitting: Gastroenterology

## 2020-08-03 ENCOUNTER — Telehealth: Payer: Self-pay | Admitting: Gastroenterology

## 2020-08-03 DIAGNOSIS — Z20822 Contact with and (suspected) exposure to covid-19: Secondary | ICD-10-CM | POA: Insufficient documentation

## 2020-08-03 DIAGNOSIS — Z01812 Encounter for preprocedural laboratory examination: Secondary | ICD-10-CM | POA: Insufficient documentation

## 2020-08-03 DIAGNOSIS — K861 Other chronic pancreatitis: Secondary | ICD-10-CM

## 2020-08-03 LAB — SARS CORONAVIRUS 2 (TAT 6-24 HRS): SARS Coronavirus 2: NEGATIVE

## 2020-08-03 MED ORDER — PREDNISONE 20 MG PO TABS: 40.0000 mg | ORAL_TABLET | Freq: Every day | ORAL | 0 refills | Status: DC

## 2020-08-03 NOTE — Telephone Encounter (Signed)
Please call pt today at [phone]. He has an ERCP tomorrow and has several questions that he would like addressed prior. Pt is very anxious to discuss with you. I have advised him you were in procedures this morning and will contact him later.

## 2020-08-03 NOTE — Telephone Encounter (Signed)
Patient LVM to call back.  He has procedure scheduled for tomorrow

## 2020-08-04 ENCOUNTER — Ambulatory Visit: Payer: Managed Care, Other (non HMO) | Admitting: Certified Registered"

## 2020-08-04 ENCOUNTER — Ambulatory Visit: Payer: Managed Care, Other (non HMO)

## 2020-08-04 ENCOUNTER — Ambulatory Visit
Admission: RE | Admit: 2020-08-04 | Discharge: 2020-08-04 | Disposition: A | Discharge status: Home / Self Care | Payer: Managed Care, Other (non HMO) | Attending: Gastroenterology | Admitting: Gastroenterology

## 2020-08-04 ENCOUNTER — Encounter: Payer: Self-pay | Admitting: Gastroenterology

## 2020-08-04 ENCOUNTER — Encounter
Admission: RE | Disposition: A | Discharge status: Home / Self Care | Payer: Self-pay | Source: Home / Self Care | Attending: Gastroenterology

## 2020-08-04 ENCOUNTER — Other Ambulatory Visit: Payer: Self-pay

## 2020-08-04 ENCOUNTER — Telehealth (HOSPITAL_COMMUNITY): Payer: Self-pay

## 2020-08-04 DIAGNOSIS — Z79899 Other long term (current) drug therapy: Secondary | ICD-10-CM | POA: Diagnosis not present

## 2020-08-04 DIAGNOSIS — R1013 Epigastric pain: Secondary | ICD-10-CM | POA: Diagnosis not present

## 2020-08-04 DIAGNOSIS — E785 Hyperlipidemia, unspecified: Secondary | ICD-10-CM | POA: Insufficient documentation

## 2020-08-04 DIAGNOSIS — Z538 Procedure and treatment not carried out for other reasons: Secondary | ICD-10-CM | POA: Diagnosis not present

## 2020-08-04 DIAGNOSIS — K831 Obstruction of bile duct: Secondary | ICD-10-CM

## 2020-08-04 DIAGNOSIS — Z888 Allergy status to other drugs, medicaments and biological substances status: Secondary | ICD-10-CM | POA: Insufficient documentation

## 2020-08-04 DIAGNOSIS — Z7982 Long term (current) use of aspirin: Secondary | ICD-10-CM | POA: Insufficient documentation

## 2020-08-04 DIAGNOSIS — Z880 Allergy status to penicillin: Secondary | ICD-10-CM | POA: Insufficient documentation

## 2020-08-04 DIAGNOSIS — Z885 Allergy status to narcotic agent status: Secondary | ICD-10-CM | POA: Insufficient documentation

## 2020-08-04 DIAGNOSIS — Z9049 Acquired absence of other specified parts of digestive tract: Secondary | ICD-10-CM | POA: Insufficient documentation

## 2020-08-04 DIAGNOSIS — Z91013 Allergy to seafood: Secondary | ICD-10-CM | POA: Insufficient documentation

## 2020-08-04 DIAGNOSIS — Z881 Allergy status to other antibiotic agents status: Secondary | ICD-10-CM | POA: Diagnosis not present

## 2020-08-04 DIAGNOSIS — I10 Essential (primary) hypertension: Secondary | ICD-10-CM | POA: Diagnosis not present

## 2020-08-04 DIAGNOSIS — R17 Unspecified jaundice: Secondary | ICD-10-CM

## 2020-08-04 DIAGNOSIS — K861 Other chronic pancreatitis: Secondary | ICD-10-CM

## 2020-08-04 HISTORY — PX: ERCP: SHX5425

## 2020-08-04 SURGERY — ERCP, WITH INTERVENTION IF INDICATED
Anesthesia: General

## 2020-08-04 MED ORDER — SUCCINYLCHOLINE CHLORIDE 200 MG/10ML IV SOSY
PREFILLED_SYRINGE | INTRAVENOUS | Status: AC
  Filled 2020-08-04: qty 10

## 2020-08-04 MED ORDER — SUCCINYLCHOLINE CHLORIDE 20 MG/ML IJ SOLN
INTRAMUSCULAR | Status: DC | PRN
  Administered 2020-08-04: 120 mg via INTRAVENOUS

## 2020-08-04 MED ORDER — ROCURONIUM BROMIDE 10 MG/ML (PF) SYRINGE
PREFILLED_SYRINGE | INTRAVENOUS | Status: AC
  Filled 2020-08-04: qty 10

## 2020-08-04 MED ORDER — DIPHENHYDRAMINE HCL 50 MG/ML IJ SOLN
INTRAMUSCULAR | Status: DC | PRN
  Administered 2020-08-04: 50 mg via INTRAVENOUS

## 2020-08-04 MED ORDER — GLYCOPYRROLATE 0.2 MG/ML IJ SOLN
INTRAMUSCULAR | Status: DC | PRN
  Administered 2020-08-04: .2 mg via INTRAVENOUS

## 2020-08-04 MED ORDER — PROPOFOL 10 MG/ML IV BOLUS
INTRAVENOUS | Status: DC | PRN
  Administered 2020-08-04: 200 mg via INTRAVENOUS

## 2020-08-04 MED ORDER — ONDANSETRON HCL 4 MG/2ML IJ SOLN
INTRAMUSCULAR | Status: DC | PRN
  Administered 2020-08-04: 4 mg via INTRAVENOUS

## 2020-08-04 MED ORDER — INDOMETHACIN 50 MG RE SUPP: 100.0000 mg | Freq: Once | RECTAL | Status: AC

## 2020-08-04 MED ORDER — METHYLPREDNISOLONE SODIUM SUCC 125 MG IJ SOLR
INTRAMUSCULAR | Status: DC | PRN
  Administered 2020-08-04: 125 mg via INTRAVENOUS

## 2020-08-04 MED ORDER — ONDANSETRON HCL 4 MG/2ML IJ SOLN
INTRAMUSCULAR | Status: AC
  Filled 2020-08-04: qty 2

## 2020-08-04 MED ORDER — PROPOFOL 500 MG/50ML IV EMUL
INTRAVENOUS | Status: AC
  Filled 2020-08-04: qty 50

## 2020-08-04 MED ORDER — INDOMETHACIN 50 MG RE SUPP
RECTAL | Status: AC
  Administered 2020-08-04: 100 mg via RECTAL
  Filled 2020-08-04: qty 2

## 2020-08-04 MED ORDER — DIPHENHYDRAMINE HCL 50 MG/ML IJ SOLN
INTRAMUSCULAR | Status: AC
  Filled 2020-08-04: qty 1

## 2020-08-04 MED ORDER — FENTANYL CITRATE (PF) 100 MCG/2ML IJ SOLN: 25.0000 ug | INTRAMUSCULAR | Status: DC | PRN

## 2020-08-04 MED ORDER — PROPOFOL 500 MG/50ML IV EMUL
INTRAVENOUS | Status: DC | PRN
  Administered 2020-08-04: 120 ug/kg/min via INTRAVENOUS

## 2020-08-04 MED ORDER — ONDANSETRON HCL 4 MG/2ML IJ SOLN: 4.0000 mg | Freq: Once | INTRAMUSCULAR | Status: DC | PRN

## 2020-08-04 MED ORDER — MEPERIDINE HCL 25 MG/ML IJ SOLN
6.2500 mg | INTRAMUSCULAR | Status: DC | PRN
  Filled 2020-08-04: qty 1

## 2020-08-04 MED ORDER — SUGAMMADEX SODIUM 200 MG/2ML IV SOLN
INTRAVENOUS | Status: DC | PRN
  Administered 2020-08-04: 200 mg via INTRAVENOUS

## 2020-08-04 MED ORDER — ROCURONIUM BROMIDE 100 MG/10ML IV SOLN
INTRAVENOUS | Status: DC | PRN
  Administered 2020-08-04: 30 mg via INTRAVENOUS
  Administered 2020-08-04: 10 mg via INTRAVENOUS

## 2020-08-04 MED ORDER — LACTATED RINGERS IV SOLN: INTRAVENOUS | Status: DC

## 2020-08-04 MED ORDER — GLYCOPYRROLATE 0.2 MG/ML IJ SOLN
INTRAMUSCULAR | Status: AC
  Filled 2020-08-04: qty 1

## 2020-08-04 MED ORDER — SODIUM CHLORIDE 0.9 % IV SOLN: INTRAVENOUS | Status: DC

## 2020-08-04 NOTE — Op Note (Addendum)
[facility] Gastroenterology Patient Name: Dennis Lambert Procedure Date: 08/04/2020 11:44 AM MRN: [id number] Account #: [id number] Date of Birth: 1955-06-28 Admit Type: Outpatient Age: 65 Room: [facility] ENDO ROOM 4 Gender: Male Note Status: Finalized Procedure:             ERCP Indications:           Autoimmune acute pancreatitis Providers:             [name] MD, MD Referring MD:          [name] MD, MD (Referring MD) Medicines:             General Anesthesia Complications:         No immediate complications. Procedure:             Pre-Anesthesia Assessment:                        - Prior to the procedure, a History and Physical was                         performed, and patient medications and allergies were                         reviewed. The patient's tolerance of previous                         anesthesia was also reviewed. The risks and benefits                         of the procedure and the sedation options and risks                         were discussed with the patient. All questions were                         answered, and informed consent was obtained. Prior                         Anticoagulants: The patient has taken no previous                         anticoagulant or antiplatelet agents. ASA Grade                         Assessment: II - A patient with mild systemic disease.                         After reviewing the risks and benefits, the patient                         was deemed in satisfactory condition to undergo the                         procedure.                        After obtaining informed consent, the scope was passed  under direct vision. Throughout the procedure, the                         patient's blood pressure, pulse, and oxygen                         saturations were monitored continuously. The [employer] D single use duodenoscope was                          introduced through the mouth, and used to inject                         contrast into and used to inject contrast into the                         bile duct. The ERCP was technically difficult and                         complex due to challenging cannulation because of                         abnormal anatomy. The patient tolerated the procedure                         well. Findings:      A scout film of the abdomen was obtained. Surgical clips, consistent       with a previous cholecystectomy, were seen in the area of the right       upper quadrant of the abdomen. The major papilla was not found in the       normal spot. It was displaced laterally and unable to access. Impression:            - The major papilla was not found in the normal spot.                         It was displaced laterally and unable to access. Recommendation:        - Discharge patient to home.                        - Resume previous diet.                        - The patient should have a trans hepatic drain placed                         with interventional radiology. Procedure Code(s):     --- Professional ---                        [phone], Endoscopic retrograde cholangiopancreatography                         (ERCP); diagnostic, including collection of                         specimen(s) by  brushing or washing, when performed                         (separate procedure) Diagnosis Code(s):     --- Professional ---                        K85.80, Other acute pancreatitis without necrosis or                         infection CPT copyright 2019 American Medical Association. All rights reserved. The codes documented in this report are preliminary and upon coder review may  be revised to meet current compliance requirements. [name] MD, MD 08/04/2020 12:37:52 PM This report has been signed electronically. Number of Addenda: 0 Note Initiated On: 08/04/2020 11:44 AM Estimated Blood Loss:  Estimated  blood loss: none.      [facility]

## 2020-08-04 NOTE — Anesthesia Postprocedure Evaluation (Signed)
Anesthesia Post Note  Patient: CUINN WESTERHOLD  Procedure(s) Performed: ENDOSCOPIC RETROGRADE CHOLANGIOPANCREATOGRAPHY (ERCP) (N/A )  Patient location during evaluation: PACU Anesthesia Type: General Level of consciousness: awake and alert, awake and oriented Pain management: pain level controlled Vital Signs Assessment: post-procedure vital signs reviewed and stable Respiratory status: spontaneous breathing, nonlabored ventilation and respiratory function stable Cardiovascular status: blood pressure returned to baseline and stable Postop Assessment: no apparent nausea or vomiting Anesthetic complications: no   No complications documented.   Last Vitals:  Vitals:   08/04/20 1245 08/04/20 1300  BP: 122/83 129/79  Pulse: 70 67  Resp: 15 14  Temp:    SpO2: 100% 99%    Last Pain:  Vitals:   08/04/20 0921  PainSc: 0-No pain                 Manfred Arch

## 2020-08-04 NOTE — Anesthesia Procedure Notes (Signed)
Procedure Name: Intubation Performed by: Mathews Argyle, CRNA Pre-anesthesia Checklist: Patient identified, Patient being monitored, Timeout performed, Emergency Drugs available and Suction available Patient Re-evaluated:Patient Re-evaluated prior to induction Oxygen Delivery Method: Circle system utilized Preoxygenation: Pre-oxygenation with 100% oxygen Induction Type: IV induction and Rapid sequence Laryngoscope Size: McGraph and 4 Grade View: Grade II Tube type: Oral Tube size: 7.5 mm Number of attempts: 1 Airway Equipment and Method: Stylet and Video-laryngoscopy Placement Confirmation: ETT inserted through vocal cords under direct vision,  positive ETCO2 and breath sounds checked- equal and bilateral Secured at: 22 cm Tube secured with: Tape Dental Injury: Teeth and Oropharynx as per pre-operative assessment

## 2020-08-04 NOTE — Anesthesia Preprocedure Evaluation (Signed)
Anesthesia Evaluation  Patient identified by MRN, date of birth, ID band Patient awake    Reviewed: Allergy & Precautions, H&P , NPO status , Patient's Chart, lab work & pertinent test results  Airway Mallampati: III  TM Distance: >3 FB Neck ROM: Full    Dental no notable dental hx. (+) Poor Dentition, Dental Advisory Given   Pulmonary neg pulmonary ROS,    Pulmonary exam normal        Cardiovascular hypertension, Pt. on medications negative cardio ROS Normal cardiovascular exam     Neuro/Psych PSYCHIATRIC DISORDERS Depression negative neurological ROS     GI/Hepatic Neg liver ROS, GERD  ,  Endo/Other  negative endocrine ROS  Renal/GU Renal disease  negative genitourinary   Musculoskeletal negative musculoskeletal ROS (+)   Abdominal   Peds negative pediatric ROS (+)  Hematology negative hematology ROS (+)   Anesthesia Other Findings Pancreatitis Dysthymic disorder    and mood swings controlled on zoloft . GERD  . Hyperlipidemia  . Hypertension  . Renal stone     Reproductive/Obstetrics negative OB ROS                             Anesthesia Physical Anesthesia Plan  ASA: III  Anesthesia Plan: General   Post-op Pain Management:    Induction: Intravenous  PONV Risk Score and Plan: 2 and Ondansetron and Dexamethasone  Airway Management Planned: Oral ETT and Video Laryngoscope Planned  Additional Equipment:   Intra-op Plan:   Post-operative Plan: Extubation in OR  Informed Consent: I have reviewed the patients History and Physical, chart, labs and discussed the procedure including the risks, benefits and alternatives for the proposed anesthesia with the patient or authorized representative who has indicated his/her understanding and acceptance.     Dental advisory given  Plan Discussed with: CRNA, Anesthesiologist and Surgeon  Anesthesia Plan Comments:          Anesthesia Quick Evaluation

## 2020-08-04 NOTE — Telephone Encounter (Signed)
-----  Message from Arne Cleveland, MD sent at 08/04/2020  2:20 PM EST ----- Regarding: RE: new referral - stent placement? Ok  IR PTC/PBD  DDH    ----- Message ----- From: Danielle Dess Sent: 08/04/2020   1:39 PM EST To: Ir Procedure Requests Subject: new referral - stent placement?                Procedure: stent placement   (Unsuccessful ERCP - urgent request for stent placement)  Dx: Jaundice  Ordering: Dr. Lucilla Lame (GI) [phone]  Imaging: MR and CT abdomen in Epic  Please review.   Thanks,  Lia Foyer

## 2020-08-04 NOTE — Transfer of Care (Signed)
Immediate Anesthesia Transfer of Care Note  Patient: Dennis Lambert  Procedure(s) Performed: ENDOSCOPIC RETROGRADE CHOLANGIOPANCREATOGRAPHY (ERCP) (N/A )  Patient Location: PACU  Anesthesia Type:General  Level of Consciousness: drowsy  Airway & Oxygen Therapy: Patient Spontanous Breathing and Patient connected to face mask oxygen  Post-op Assessment: Report given to RN and Post -op Vital signs reviewed and stable  Post vital signs: Reviewed  Last Vitals:  Vitals Value Taken Time  BP    Temp    Pulse 69 08/04/20 1243  Resp 25 08/04/20 1243  SpO2 99 % 08/04/20 1243  Vitals shown include unvalidated device data.  Last Pain:  Vitals:   08/04/20 0921  PainSc: 0-No pain         Complications: No complications documented.

## 2020-08-04 NOTE — Interval H&P Note (Signed)
[name], MD [facility] [address] [place], [place] [address] Phone:[phone] Fax : [phone]  Primary Care Physician:  Marguarite Arbour, MD Primary Gastroenterologist:  Dr. Servando Snare  Pre-Procedure History & Physical: HPI:  Dennis Lambert is a 65 y.o. male is here for an ERCP.   Past Medical History:  Diagnosis Date   Dysthymic disorder    and mood swings controlled on zoloft   GERD (gastroesophageal reflux disease)    Hyperlipidemia    Hypertension    Renal stone     Past Surgical History:  Procedure Laterality Date   CHOLECYSTECTOMY  04/28/01   Daphine Deutscher)   Hepatobiliary scan  04/17/01   +   UPPER GASTROINTESTINAL ENDOSCOPY  ~94   Reflux    Prior to Admission medications   Medication Sig Start Date End Date Taking? Authorizing Provider  lansoprazole (PREVACID) 30 MG capsule Take by mouth. 07/10/20 07/10/21 Yes [provider]  sertraline (ZOLOFT) 100 MG tablet TAKE 1 TABLET BY MOUTH  DAILY 06/19/20  Yes Joaquim Nam, MD  aspirin 325 MG tablet Take 325 mg by mouth daily.    [provider]  b complex vitamins tablet Take 1 tablet by mouth daily.    [provider]  HYDROcodone-acetaminophen (NORCO/VICODIN) 5-325 MG tablet Take 1 tablet by mouth every 6 (six) hours as needed for moderate pain or severe pain (for renal stones). Patient not taking: No sig reported 10/26/18   Joaquim Nam, MD  predniSONE (DELTASONE) 20 MG tablet Take 2 tablets (40 mg total) by mouth daily with breakfast for 28 days. 08/03/20 08/31/20  Toney Reil, MD  simvastatin (ZOCOR) 40 MG tablet TAKE 1 TABLET BY MOUTH AT  BEDTIME Patient not taking: Reported on 07/31/2020 06/19/20   Joaquim Nam, MD    Allergies as of 07/31/2020 - Review Complete 07/31/2020  Allergen Reaction Noted   Amoxicillin     Clarithromycin     Codeine sulfate     Flomax [tamsulosin] Other (See Comments) 11/27/2018   Iodine Other (See Comments) 07/10/2020   Shellfish allergy   10/11/2011    Family History  Problem Relation Age of Onset   Hypertension Mother    Heart disease Mother        CHF   Kidney disease Mother        Renal disease   Cancer Mother        lung cancer   Fibromyalgia Sister    COPD Father    Heart disease Other    Hypertension Other    Stroke Other    Depression Neg Hx    Alcohol abuse Neg Hx    Drug abuse Neg Hx    Colon cancer Neg Hx    Prostate cancer Neg Hx     Social History   Socioeconomic History   Marital status: Married    Spouse name: Not on file   Number of children: 1   Years of education: Not on file   Highest education level: Not on file  Occupational History   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    Employer: [employer]   Occupation: [employer] - started in 2009  Tobacco Use   Smoking status: Never Smoker   Smokeless tobacco: Never Used  Vaping Use   Vaping Use: Never used  Substance and Sexual Activity   Alcohol use: No   Drug use: No   Sexual activity: Not on file  Other  Topics Concern   Not on file  Social History Narrative   From [place]   Married 1983   1 son   Prev with [employer]   Retired as of 2018   Social Determinants of Corporate investment banker Strain: Not on [employer] Insecurity: Not on file  Transportation Needs: Not on file  Physical Activity: Not on file  Stress: Not on file  Social Connections: Not on file  Intimate Partner Violence: Not on file    Review of Systems: See HPI, otherwise negative ROS  Physical Exam: BP 118/68   Pulse 75   Temp (!) 96.6 F (35.9 C)   Resp 16   Ht 5\' 10"  (1.778 m)   Wt 95.3 kg   SpO2 100%   BMI 30.13 kg/m  General:   Alert,  pleasant and cooperative in NAD Head:  Normocephalic and atraumatic. Neck:  Supple; no masses or thyromegaly. Lungs:  Clear throughout to auscultation.    Heart:  Regular rate and rhythm. Abdomen:  Soft, nontender and nondistended. Normal bowel sounds,  without guarding, and without rebound.   Neurologic:  Alert and  oriented x4;  grossly normal neurologically.  Impression/Plan: is here for an ERCP to be performed for jaundice  Risks, benefits, limitations, and alternatives regarding  ERCP have been reviewed with the patient.  Questions have been answered.  All parties agreeable.   Orlena Sheldon, MD  08/04/2020, 10:06 AM

## 2020-08-05 ENCOUNTER — Other Ambulatory Visit: Payer: Self-pay | Admitting: Radiology

## 2020-08-05 ENCOUNTER — Telehealth: Payer: Self-pay | Admitting: Gastroenterology

## 2020-08-05 ENCOUNTER — Other Ambulatory Visit: Payer: Self-pay | Admitting: Student

## 2020-08-05 ENCOUNTER — Encounter: Payer: Self-pay | Admitting: Gastroenterology

## 2020-08-05 MED ORDER — BUPIVACAINE HCL (PF) 0.5 % IJ SOLN
INTRAMUSCULAR | Status: AC
  Filled 2020-08-05: qty 10

## 2020-08-05 NOTE — Progress Notes (Signed)
Script for contrast allergy premedication sent to [facility] in [place].   [facility] and spoke with patient and wife.  Answered all questions.  They are aware of premedication instructions.   Loyce Dys, MS RD PA-C 2:02 PM

## 2020-08-05 NOTE — Telephone Encounter (Signed)
Radiologist cell number [phone]. Please call stat

## 2020-08-06 ENCOUNTER — Other Ambulatory Visit: Payer: Self-pay | Admitting: Radiology

## 2020-08-06 ENCOUNTER — Encounter: Payer: Self-pay | Admitting: Gastroenterology

## 2020-08-06 ENCOUNTER — Other Ambulatory Visit: Payer: Self-pay | Admitting: Gastroenterology

## 2020-08-06 ENCOUNTER — Other Ambulatory Visit: Payer: Self-pay

## 2020-08-06 ENCOUNTER — Other Ambulatory Visit: Payer: Self-pay | Admitting: *Deleted

## 2020-08-06 DIAGNOSIS — R17 Unspecified jaundice: Secondary | ICD-10-CM

## 2020-08-06 DIAGNOSIS — K831 Obstruction of bile duct: Secondary | ICD-10-CM

## 2020-08-07 ENCOUNTER — Observation Stay: Payer: Managed Care, Other (non HMO)

## 2020-08-07 ENCOUNTER — Ambulatory Visit: Payer: Managed Care, Other (non HMO)

## 2020-08-07 ENCOUNTER — Other Ambulatory Visit: Payer: Self-pay

## 2020-08-07 ENCOUNTER — Other Ambulatory Visit: Payer: Self-pay | Admitting: Radiology

## 2020-08-07 ENCOUNTER — Observation Stay
Admission: RE | Admit: 2020-08-07 | Discharge: 2020-08-07 | Disposition: A | Discharge status: Home / Self Care | Payer: Managed Care, Other (non HMO) | Source: Ambulatory Visit | Attending: Interventional Radiology | Admitting: Interventional Radiology

## 2020-08-07 ENCOUNTER — Ambulatory Visit (HOSPITAL_COMMUNITY): Admit: 2020-08-07 | Payer: Managed Care, Other (non HMO)

## 2020-08-07 ENCOUNTER — Emergency Department
Admission: EM | Admit: 2020-08-07 | Discharge: 2020-08-07 | Disposition: A | Discharge status: Home / Self Care | Payer: Managed Care, Other (non HMO)

## 2020-08-07 ENCOUNTER — Observation Stay
Admission: RE | Admit: 2020-08-07 | Discharge: 2020-08-08 | Disposition: A | Discharge status: Home / Self Care | Payer: Managed Care, Other (non HMO) | Source: Ambulatory Visit | Attending: Internal Medicine | Admitting: Internal Medicine

## 2020-08-07 DIAGNOSIS — K831 Obstruction of bile duct: Secondary | ICD-10-CM | POA: Diagnosis present

## 2020-08-07 DIAGNOSIS — Z885 Allergy status to narcotic agent status: Secondary | ICD-10-CM | POA: Diagnosis not present

## 2020-08-07 DIAGNOSIS — Z888 Allergy status to other drugs, medicaments and biological substances status: Secondary | ICD-10-CM | POA: Insufficient documentation

## 2020-08-07 DIAGNOSIS — F32A Depression, unspecified: Secondary | ICD-10-CM | POA: Insufficient documentation

## 2020-08-07 DIAGNOSIS — Z7982 Long term (current) use of aspirin: Secondary | ICD-10-CM | POA: Diagnosis not present

## 2020-08-07 DIAGNOSIS — R17 Unspecified jaundice: Secondary | ICD-10-CM

## 2020-08-07 DIAGNOSIS — E785 Hyperlipidemia, unspecified: Secondary | ICD-10-CM | POA: Diagnosis not present

## 2020-08-07 DIAGNOSIS — K861 Other chronic pancreatitis: Principal | ICD-10-CM | POA: Insufficient documentation

## 2020-08-07 DIAGNOSIS — I10 Essential (primary) hypertension: Secondary | ICD-10-CM | POA: Insufficient documentation

## 2020-08-07 DIAGNOSIS — Z880 Allergy status to penicillin: Secondary | ICD-10-CM | POA: Diagnosis not present

## 2020-08-07 DIAGNOSIS — D72829 Elevated white blood cell count, unspecified: Secondary | ICD-10-CM | POA: Diagnosis not present

## 2020-08-07 DIAGNOSIS — R7989 Other specified abnormal findings of blood chemistry: Secondary | ICD-10-CM

## 2020-08-07 HISTORY — PX: IR PERC CHOLECYSTOSTOMY: IMG2326

## 2020-08-07 LAB — COMPREHENSIVE METABOLIC PANEL
ALT: 214 U/L — ABNORMAL HIGH (ref 0–44)
AST: 153 U/L — ABNORMAL HIGH (ref 15–41)
Albumin: 2.7 g/dL — ABNORMAL LOW (ref 3.5–5.0)
Alkaline Phosphatase: 827 U/L — ABNORMAL HIGH (ref 38–126)
Anion gap: 9 (ref 5–15)
BUN: 21 mg/dL (ref 8–23)
CO2: 25 mmol/L (ref 22–32)
Calcium: 9.3 mg/dL (ref 8.9–10.3)
Chloride: 98 mmol/L (ref 98–111)
Creatinine, Ser: 0.82 mg/dL (ref 0.61–1.24)
GFR, Estimated: >60 mL/min (ref >60)
Glucose, Bld: 207 mg/dL — ABNORMAL HIGH (ref 70–99)
Potassium: 4.6 mmol/L (ref 3.5–5.1)
Sodium: 132 mmol/L — ABNORMAL LOW (ref 135–145)
Total Bilirubin: 16.2 mg/dL — ABNORMAL HIGH (ref 0.3–1.2)
Total Protein: 7.8 g/dL (ref 6.5–8.1)

## 2020-08-07 LAB — URINALYSIS, COMPLETE (UACMP) WITH MICROSCOPIC
Bacteria, UA: NONE SEEN
Glucose, UA: NEGATIVE mg/dL
Hgb urine dipstick: NEGATIVE
Ketones, ur: NEGATIVE mg/dL
Leukocytes,Ua: NEGATIVE
Nitrite: NEGATIVE
Protein, ur: NEGATIVE mg/dL
Specific Gravity, Urine: 1.024 (ref 1.005–1.030)
Squamous Epithelial / HPF: NONE SEEN (ref 0–5)
WBC, UA: NONE SEEN WBC/hpf (ref 0–5)
pH: 5 (ref 5.0–8.0)

## 2020-08-07 LAB — LACTIC ACID, PLASMA
Lactic Acid, Venous: 2.1 mmol/L (ref 0.5–1.9)
Lactic Acid, Venous: 2.1 mmol/L (ref 0.5–1.9)

## 2020-08-07 LAB — TSH: TSH: 0.46 u[IU]/mL (ref 0.350–4.500)

## 2020-08-07 LAB — CBC
HCT: 35.1 % — ABNORMAL LOW (ref 39.0–52.0)
Hemoglobin: 11.7 g/dL — ABNORMAL LOW (ref 13.0–17.0)
MCH: 29.3 pg (ref 26.0–34.0)
MCHC: 33.3 g/dL (ref 30.0–36.0)
MCV: 88 fL (ref 80.0–100.0)
Platelets: 337 10*3/uL (ref 150–400)
RBC: 3.99 MIL/uL — ABNORMAL LOW (ref 4.22–5.81)
RDW: 22.5 % — ABNORMAL HIGH (ref 11.5–15.5)
WBC: 13.5 10*3/uL — ABNORMAL HIGH (ref 4.0–10.5)
nRBC: 0 % (ref 0.0–0.2)

## 2020-08-07 LAB — PROTIME-INR
INR: 1.3 — ABNORMAL HIGH (ref 0.8–1.2)
Prothrombin Time: 16.1 seconds — ABNORMAL HIGH (ref 11.4–15.2)

## 2020-08-07 LAB — HIV ANTIBODY (ROUTINE TESTING W REFLEX): HIV Screen 4th Generation wRfx: NONREACTIVE

## 2020-08-07 IMAGING — XA IR CHOLECYSTOSTOMY
13 of 14 series · 14 of 24 positions shown · non-contrast
Comparison: [DATE]

INDICATION: 64-year-old male with obstructive jaundice secondary to hilar
biliary stricture of indeterminate etiology.

EXAM:
ULTRASOUND AND FLUOROSCOPIC GUIDED PERCUTANEOUS TRANSHEPATIC
CHOLANGIOGRAM AND BILIARY TUBE PLACEMENT
TECHNIQUE: Informed written consent was obtained from the patient after a
discussion of the risks, benefits and alternatives to treatment.
Questions regarding the procedure were encouraged and answered. A
timeout was performed prior to the initiation of the procedure.

[Series 1: subclavian 3 fps · 1 of 1 slices shown (1 of 4)]
[im 1/1]
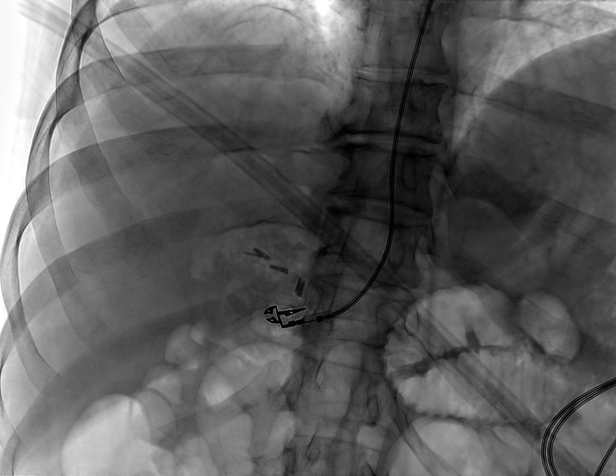

[Series 4: subclavian 3 fps · 1 of 1 slices shown (2 of 4)]
[im 1/1]
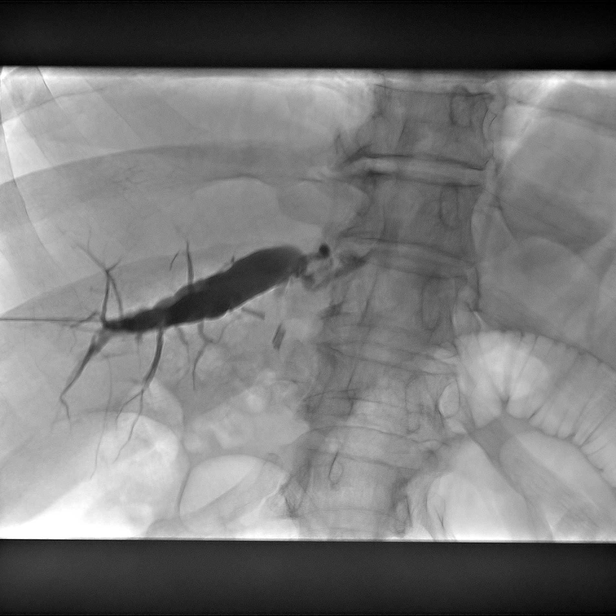

[Series 5: fluoroscopy - stored · 1 of 74 frames shown (1 of 6)]
[frame 38/74]
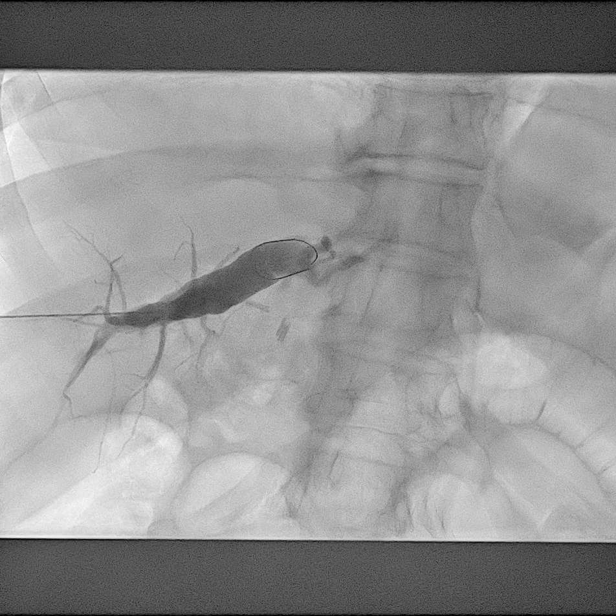

[Series 6: subclavian 3 fps · 1 of 1 slices shown (3 of 4)]
[im 1/1]
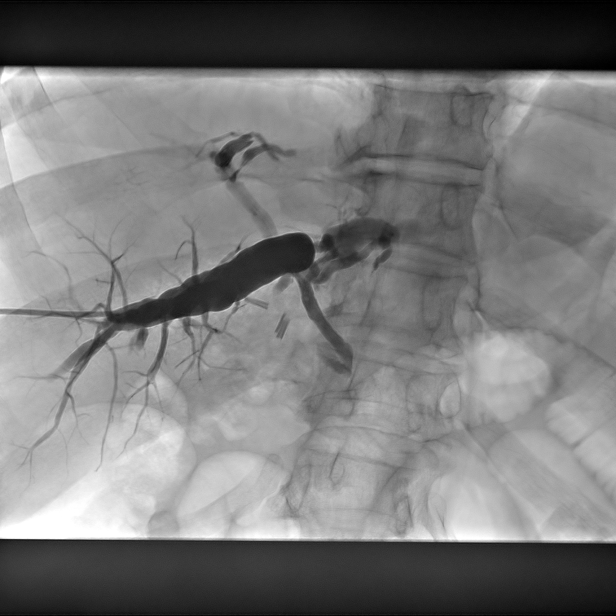

[Series 8: subclavian 3 fps · 1 of 1 slices shown (4 of 4)]
[im 1/1]
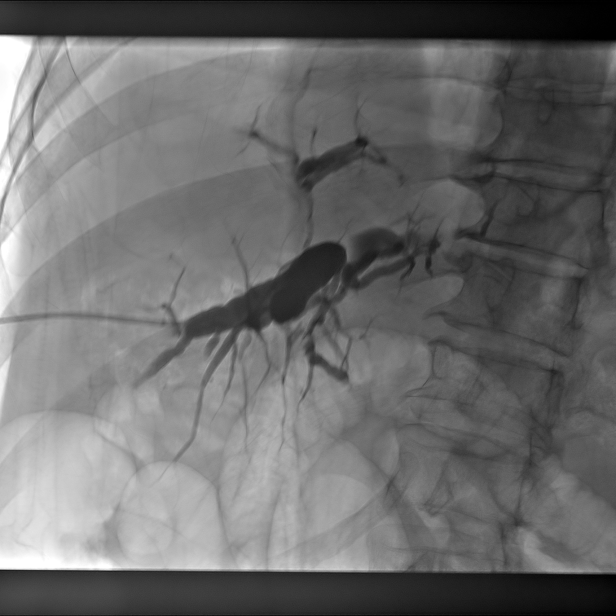

[Series 9: fluoroscopy - stored · 1 of 9 frames shown (2 of 6)]
[frame 5/9]
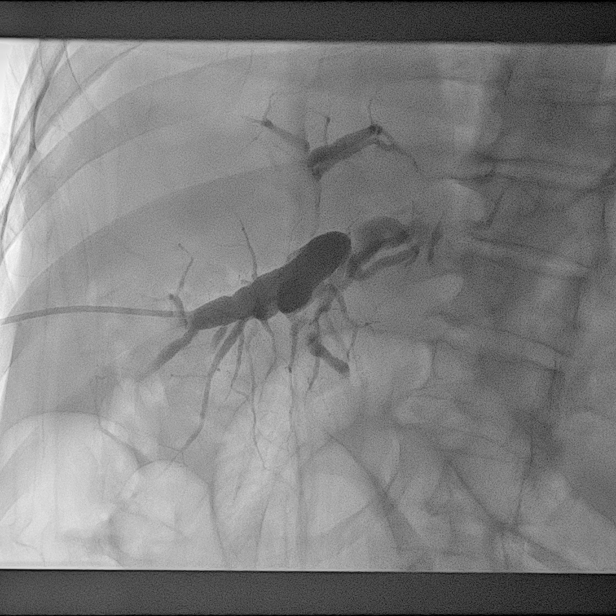

[Series 10: lungs 6 fps · 1 of 1 slices shown (1 of 3)]
[im 1/1]
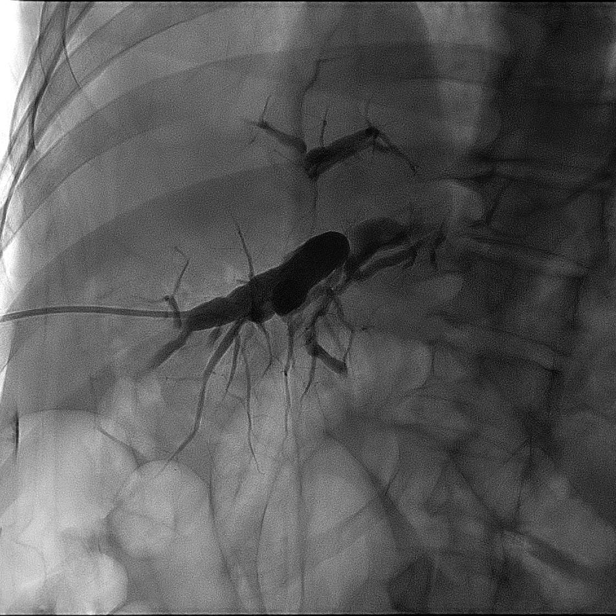

[Series 12: fluoroscopy - stored · 2 of 450 frames shown (3 of 6)]
[frame 68/450]
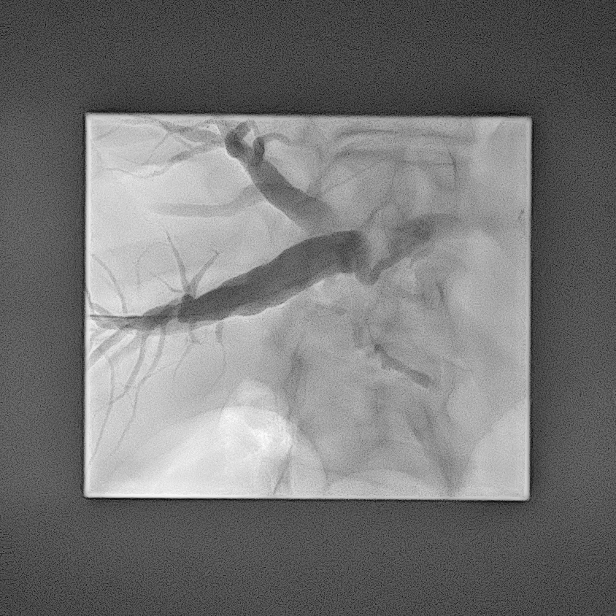
[frame 320/450]
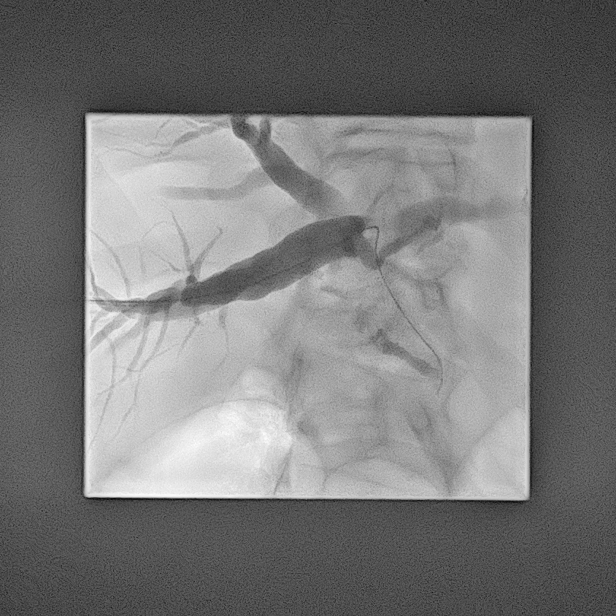

[Series 13: fluoroscopy - stored · 1 of 51 frames shown (4 of 6)]
[frame 8/51]
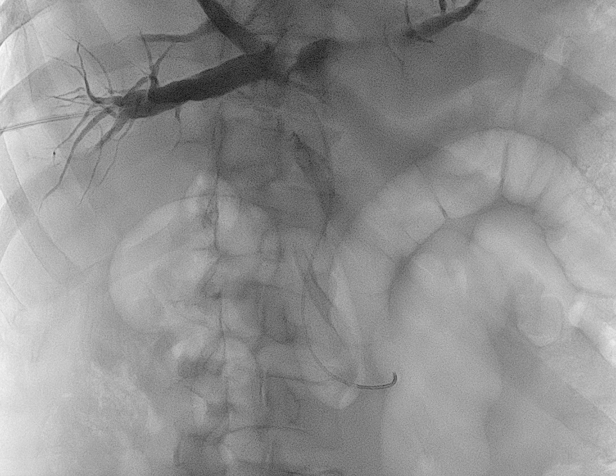

[Series 14: lungs 6 fps · 1 of 1 slices shown (2 of 3)]
[im 1/1]
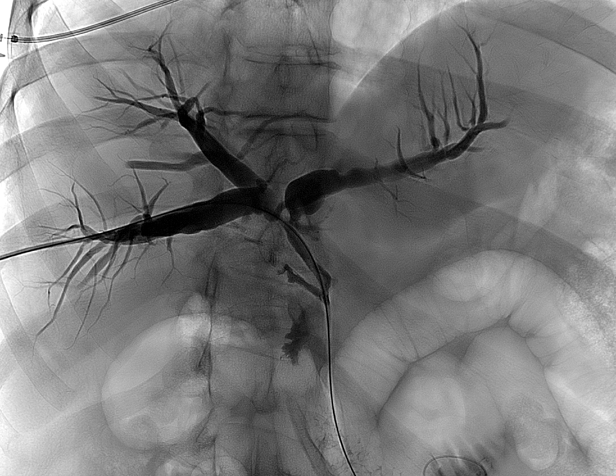

[Series 15: lungs 6 fps · 1 of 1 slices shown (3 of 3)]
[im 1/1]
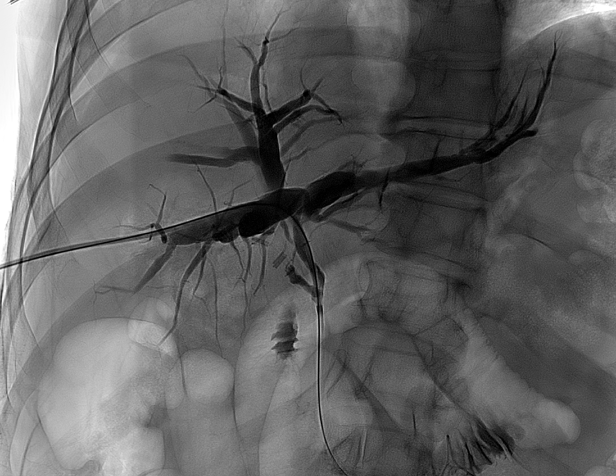

[Series 17: fluoroscopy - stored · 1 of 277 frames shown (5 of 6)]
[frame 42/277]
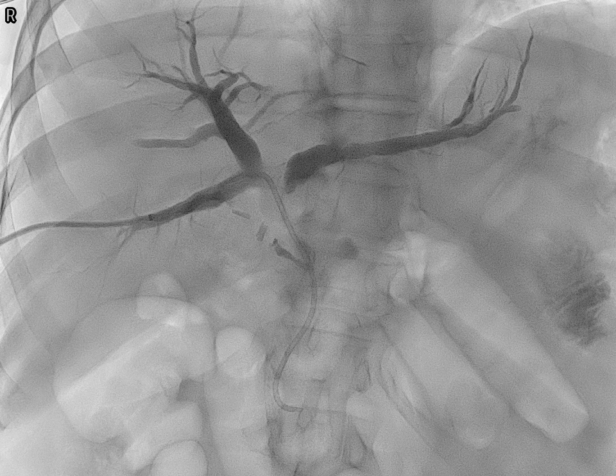

[Series 17: fluoroscopy - stored · 1 of 1 slices shown (6 of 6)]
[im 1/1]
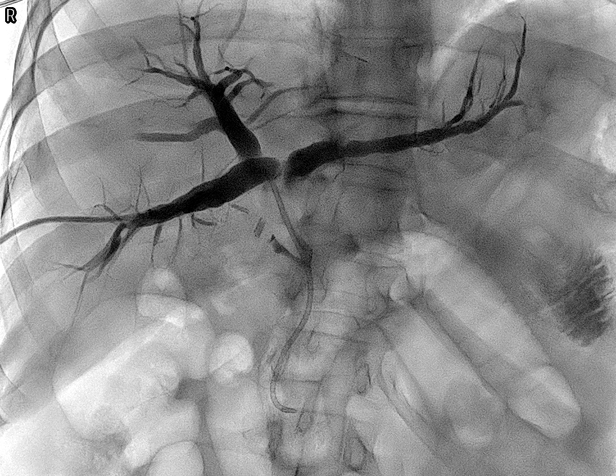

[14 of 24 positions shown; findings below may reference images not displayed]

MEDICATIONS:
Levofloxacin 500 mg, intravenous; The antibiotic was administered
with an appropriate time frame prior to the initiation of the
procedure

CONTRAST:  55 mL Omnipaque 300-administered into the biliary tree.

ANESTHESIA/SEDATION:
Moderate (conscious) sedation was employed during this procedure. A
total of Versed 1 mg and Fentanyl 100 mcg was administered
intravenously.

Moderate Sedation Time: 39 minutes. The patient's level of
consciousness and vital signs were monitored continuously by
radiology nursing throughout the procedure under my direct
supervision.

FLUOROSCOPY TIME:  7.8 minutes, 447 mGy

COMPLICATIONS:
None immediate.
The right upper abdominal quadrant was prepped and draped in the
usual sterile fashion, and a sterile drape was applied covering the
operative field. Maximum barrier sterile technique with sterile
gowns and gloves were used for the procedure. A timeout was
performed prior to the initiation of the procedure.

Ultrasound scanning of the right upper abdominal quadrant was
performed to delineate the anatomy and avoid transgression of the
gallbladder or the pleural. A spot along the right mid axillary line
was marked fluoroscopically inferior to the right costophrenic
angle.

After the overlying soft tissues were anesthetized with 1% Lidocaine
with epinephrine, under direct ultrasound guidance, a 22 gauge Chiba
needle was utilized to cannulate the peripheral aspect of a right
intrahepatic biliary duct. Appropriate position was confirmed with
limited contrast injection. Next, the duct was cannulated with a
Nitrex wire and dilated with an Accustick set under fluoroscopic
guidance. Limited cholangiograms were performed in various
obliquities confirming appropriate access.

Next, a 4 French angled glide catheter was advanced through the
outer sheath of the Accustick set and with the use of a stiff
Glidewire, advanced through the biliary hilum, common bile duct and
ampulla to the level of the duodenum. Contrast injection confirmed
appropriate positioning.

Under intermittent fluoroscopic guidance and over an Amplatz wire,
the track was dilated ultimately allowing placement of a 8.5 French
biliary drainage catheter with coil ultimately locked within the
duodenum. Contrast was injected and a completion radiographs were
obtained in various obliquities.

The catheter was connected to a drainage bag which yielded the brisk
return of mildly blood tinged bile. The catheter was secured to the
skin with an interrupted suture and StatLock device. Dressings were
applied. The patient tolerated the procedure well without immediate
postprocedural complication.
FINDINGS: Sonographic evaluation of the liver demonstrates marked intrahepatic
biliary ductal dilatation as was demonstrated on preceding abdominal
MRI.

Under direct ultrasound guidance, a dilated peripheral duct within
the anterior segment the right lobe of the liver was accessed
allowing placement of a 8.5 French biliary drainage catheter with
end ultimately coiled and locked within the duodenum and radiopaque
side marker located proximal to the level of the biliary hilum.

Cholangiogram significant for high-grade stricture at the hilum with
free flow of contrast from the right anterior ducts to the right
posterior, left, and nondilated common bile duct.
IMPRESSION: Successful placement of a 8.5 French percutaneous biliary drainage
catheter with end coiled and locked within the duodenum.
Cholangiogram significant for high-grade stricture at the hilum with
free passes of contrast from the right anterior ducts to the right
posterior, left, and nondilated common bile duct. No definite
evidence of primary sclerosing cholangitis. Hilar stricture etiology
remains indeterminate.

PLAN:
Keep to bag drainage. Return in 6-8 weeks for repeat diagnostic
cholangiogram, possible tube upsize, possible cholangioplasty.

## 2020-08-07 IMAGING — DX DG CHEST 1V PORT
1 series · 1 of 1 positions shown · non-contrast
Comparison: None.

CLINICAL DATA: Elevated lactic acid

EXAM:
PORTABLE CHEST 1 VIEW

[chest ap]
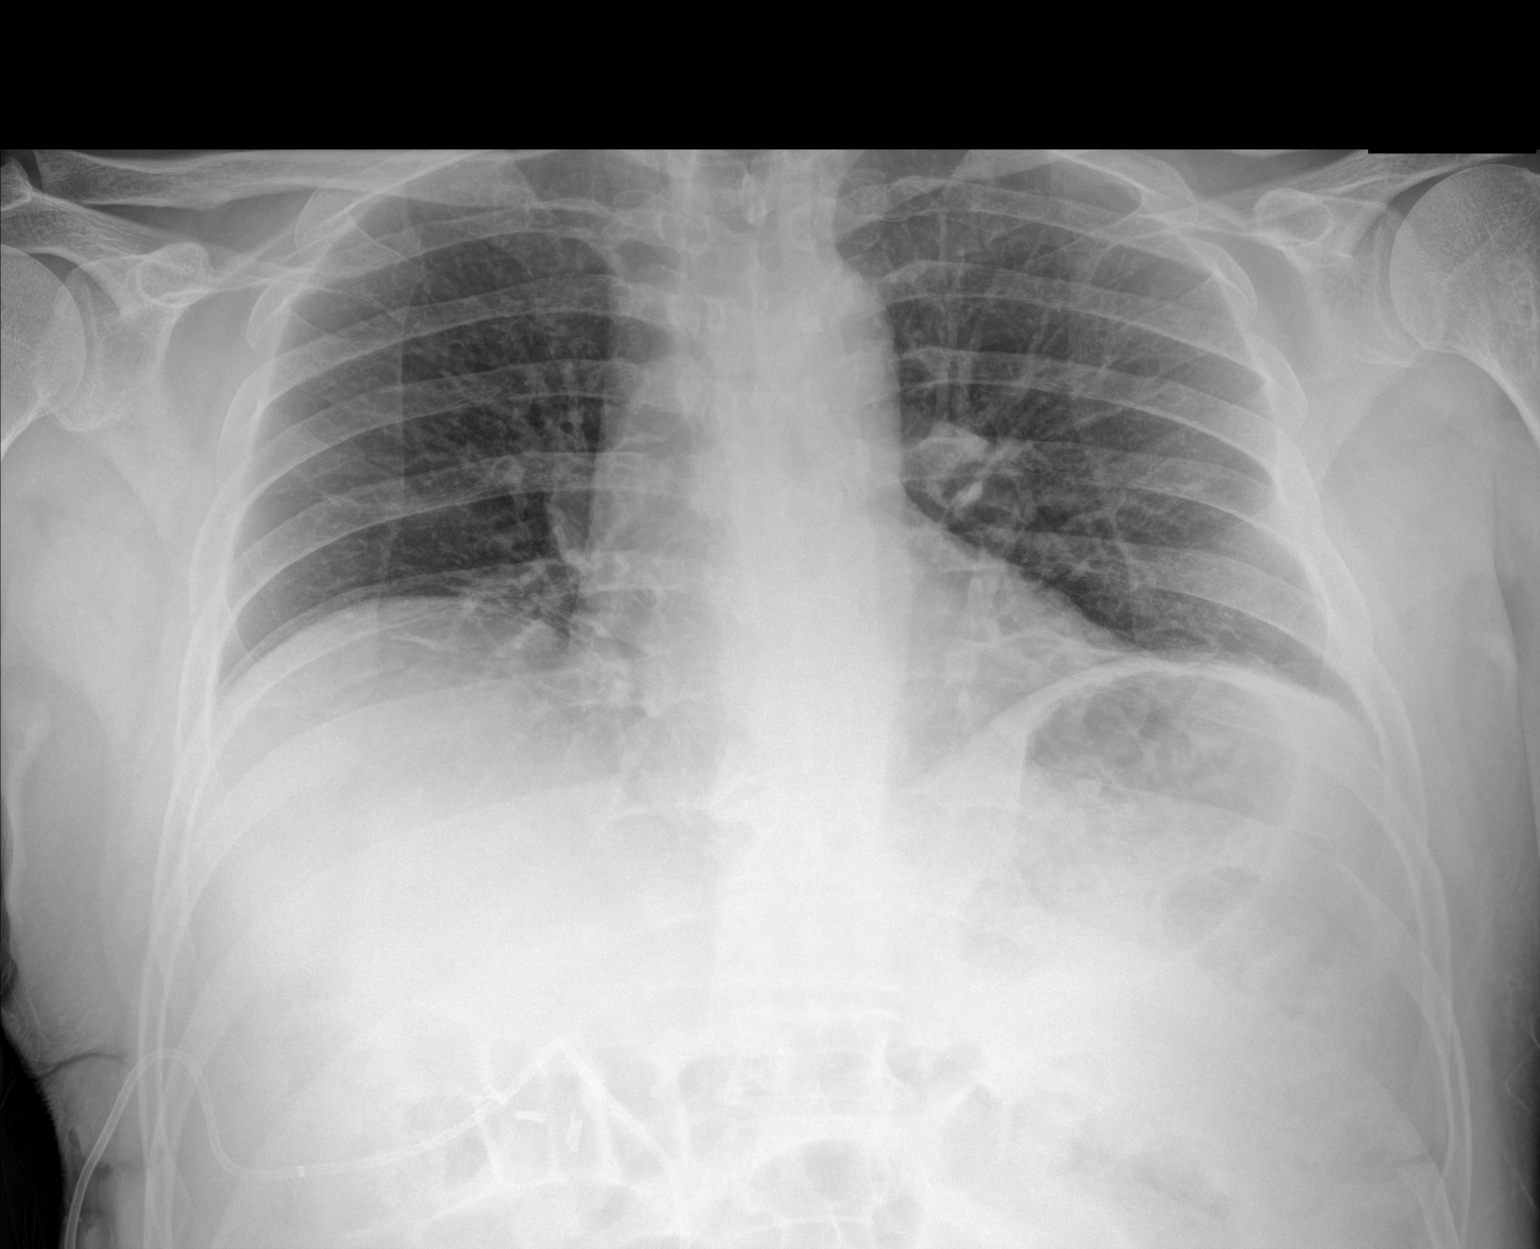

[1 of 1 positions shown; findings below may reference images not displayed]

FINDINGS: Low lung volumes. Normal heart size. Normal mediastinal contour. No
pneumothorax. No pleural effusion. Mild bibasilar atelectasis. No
pulmonary edema. Partially visualized surgical drain in the right
upper quadrant of the abdomen with cholecystectomy clips.
IMPRESSION: Low lung volumes with mild bibasilar atelectasis.

## 2020-08-07 MED ORDER — ONDANSETRON HCL 4 MG/2ML IJ SOLN
INTRAMUSCULAR | Status: AC
  Filled 2020-08-07: qty 2

## 2020-08-07 MED ORDER — SODIUM CHLORIDE 0.9% FLUSH: 5.0000 mL | Freq: Three times a day (TID) | INTRAVENOUS | Status: DC

## 2020-08-07 MED ORDER — DIPHENHYDRAMINE HCL 50 MG/ML IJ SOLN
INTRAMUSCULAR | Status: AC | PRN
  Administered 2020-08-07: 50 mg via INTRAVENOUS

## 2020-08-07 MED ORDER — MORPHINE SULFATE (PF) 2 MG/ML IV SOLN: 2.0000 mg | INTRAVENOUS | Status: DC | PRN

## 2020-08-07 MED ORDER — DIPHENHYDRAMINE HCL 50 MG/ML IJ SOLN
INTRAMUSCULAR | Status: AC
  Filled 2020-08-07: qty 1

## 2020-08-07 MED ORDER — ONDANSETRON HCL 4 MG/2ML IJ SOLN
INTRAMUSCULAR | Status: AC | PRN
  Administered 2020-08-07: 4 mg via INTRAVENOUS

## 2020-08-07 MED ORDER — ONDANSETRON HCL 4 MG PO TABS: 4.0000 mg | ORAL_TABLET | Freq: Four times a day (QID) | ORAL | Status: DC | PRN

## 2020-08-07 MED ORDER — PANTOPRAZOLE SODIUM 40 MG PO TBEC
40.0000 mg | DELAYED_RELEASE_TABLET | Freq: Every day | ORAL | Status: DC
  Filled 2020-08-07: qty 1

## 2020-08-07 MED ORDER — ONDANSETRON HCL 4 MG/2ML IJ SOLN: 4.0000 mg | Freq: Four times a day (QID) | INTRAMUSCULAR | Status: DC | PRN

## 2020-08-07 MED ORDER — ACETAMINOPHEN 650 MG RE SUPP: 325.0000 mg | Freq: Four times a day (QID) | RECTAL | Status: DC | PRN

## 2020-08-07 MED ORDER — FENTANYL CITRATE (PF) 100 MCG/2ML IJ SOLN
INTRAMUSCULAR | Status: AC | PRN
  Administered 2020-08-07 (×2): 50 ug via INTRAVENOUS

## 2020-08-07 MED ORDER — FENTANYL CITRATE (PF) 100 MCG/2ML IJ SOLN
INTRAMUSCULAR | Status: AC
  Filled 2020-08-07: qty 2

## 2020-08-07 MED ORDER — SERTRALINE HCL 50 MG PO TABS
100.0000 mg | ORAL_TABLET | Freq: Every day | ORAL | Status: DC
  Filled 2020-08-07: qty 2

## 2020-08-07 MED ORDER — SODIUM CHLORIDE 0.9 % IV SOLN: INTRAVENOUS | Status: DC

## 2020-08-07 MED ORDER — MIDAZOLAM HCL 5 MG/5ML IJ SOLN
INTRAMUSCULAR | Status: AC
  Filled 2020-08-07: qty 5

## 2020-08-07 MED ORDER — LEVOFLOXACIN IN D5W 500 MG/100ML IV SOLN
500.0000 mg | INTRAVENOUS | Status: AC
  Administered 2020-08-07: 11:00:00 500 mg via INTRAVENOUS
  Filled 2020-08-07: qty 100

## 2020-08-07 MED ORDER — ACETAMINOPHEN 325 MG PO TABS: 325.0000 mg | ORAL_TABLET | Freq: Four times a day (QID) | ORAL | Status: DC | PRN

## 2020-08-07 MED ORDER — MIDAZOLAM HCL 2 MG/2ML IJ SOLN
INTRAMUSCULAR | Status: AC | PRN
Start: 2020-08-07 — End: 2020-08-07
  Administered 2020-08-07: 1 mg via INTRAVENOUS

## 2020-08-07 MED ORDER — METOPROLOL TARTRATE 5 MG/5ML IV SOLN: 5.0000 mg | INTRAVENOUS | Status: DC | PRN

## 2020-08-07 NOTE — Progress Notes (Signed)
Pt arrived to 103 wife at bedside, alert and oriented on room air, denies pain, drain intact, bandage is dry and intact, no drainage on bandage noted.

## 2020-08-07 NOTE — H&P (Addendum)
History and Physical   Dennis Lambert YSA:[id number] DOB: January 19, 1956 DOA: (Not on file)  PCP: Marguarite Arbour, MD  Outpatient Specialists: Dr. Servando Snare, gastroenterologist Patient coming from: Interventional radiology, surgery recovery  I have personally briefly reviewed patient's old medical records in [facility] EMR.  Chief Concern: Abnormal labs  HPI: Dennis Lambert is a 65 y.o. male with medical history significant for GERD, anxiety/depression, hyperlipidemia, presented outpatient interventional radiology procedure for percutaneous cholangiogram with possible biliary drain placement.  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  He was sent by his PCP to Dr. Servando Snare for outpatient ERCP on 08/04/2020. ERCP attempt was unsuccessful due to inability to dilate the ampulla.  He was then sent for outpatient IR intervention for a percutaneous biliary drain per Dr. Annabell Sabal request.  Patient underwent percutaneous biliary drain with cholangiogram on 08/07/2020.  There was concern for transient sepsis postprocedure, therefore interventional radiology team requested hospitalist for admission for observation.  At bedside, patient is jaundiced. Patient was awake alert and oriented to self, age, location, and spouse at bedside.  He denies fever, nausea, vomiting, chest pain, diarrhea, abdominal pain, dysuria, hematuria, vision changes.  He denies pain with swallowing.  Patient has never had a colonoscopy, and states that his doctor orders outpatient stool samples every year and has always been negative.  Of note, he has intentionally lost 20 pounds in the last year from diet increase physical activity.  Social history: retired, formerly worked as an Systems developer. Lives with his spouse and they have one child who is 24 years old. He does not use tobacco products, etoh, or recreational drugs  Vaccine: He has not been vaccinated for covid and does not ever want  to be vaccinated.  ROS: Constitutional: no weight change, no fever ENT/Mouth: no sore throat, no rhinorrhea Eyes: no eye pain, no vision changes Cardiovascular: no chest pain, no dyspnea,  no edema, no palpitations Respiratory: no cough, no sputum, no wheezing Gastrointestinal: no nausea, no vomiting, no diarrhea, no constipation Genitourinary: no urinary incontinence, no dysuria, no hematuria Musculoskeletal: no arthralgias, no myalgias Skin: no skin lesions, no pruritus, Neuro: + weakness, no loss of consciousness, no syncope Psych: no anxiety, no depression, + decrease appetite Heme/Lymph: no bruising, no bleeding  Hospital Course: as above   Assessment/Plan  Active Problems:   Obstructive jaundice   Obstructive jaundice status post unsuccessful ERCP and status post percutaneous biliary drain with cholangiogram Did LFTs Elevated WBC -Per IR midlevel, the drain went through liver and he was concern for possible transients sepsis -Check lactic x2, if positive will order blood cultures -Status post levofloxacin 500 mg IV once per IR midlevel -His LFTs and diabetes continue to downtrend, per interventional radiology team, patient can be discharged home with the per drain and follow-up with outpatient IR -BMP and LFT in the AM -CBC in the AM  Health maintenance-recommended patient to have at least 1 colonoscopy as outpatient Cologuard test can have a false negative  Hyperlipidemia-holding home simvastatin due to elevated LFTs  Anxiety/depression-resumed home sertraline 100 mg daily  Chart reviewed.   DVT prophylaxis: TED hose, holding home pharmacologic DVT due to increased risk of bleeding Code Status: FULL code Diet: Heart healthy Family Communication: Updated spouse at bedside Disposition Plan: Pending clinical course Consults called: None at this time Admission status: Observation with telemetry  Past Medical History:  Diagnosis Date  . Dysthymic disorder    and  mood swings controlled on zoloft  .  GERD (gastroesophageal reflux disease)   . Hyperlipidemia   . Hypertension   . Renal stone    Past Surgical History:  Procedure Laterality Date  . CHOLECYSTECTOMY  04/28/01   Dennis Lambert)  . ERCP N/A 08/04/2020   Procedure: ENDOSCOPIC RETROGRADE CHOLANGIOPANCREATOGRAPHY (ERCP);  Surgeon: [name], MD;  Location[facility] ENDOSCOPY;  Service: Endoscopy;  Laterality: N/A;  . Hepatobiliary scan  04/17/01   +  . UPPER GASTROINTESTINAL ENDOSCOPY  ~94   Reflux   Social History:  reports that he has never smoked. He has never used smokeless tobacco. He reports that he does not drink alcohol and does not use drugs.  Allergies  Allergen Reactions  . Amoxicillin     REACTION: Mouth Swelling- this is questionable and was discussed 10/11/11.    Marland Kitchen Clarithromycin     REACTION: GI Upset  . Codeine Sulfate     REACTION: N \\T \ V  . Flomax [Tamsulosin] Other (See Comments)    Patient can tolerate but noted to be lightheaded with use.  Not an allergy.   . Iodine Other (See Comments)    Pt states he is allergic to iodine with a unknown reaction. Pt states allergy happened many years ago.  Pt CT scan is for WO IV contrast and is drinking Barium for oral contrast.   . Shellfish Allergy     Prev with shellfish intolerance but able to eat all seafood as of 03/2016.     Family History  Problem Relation Age of Onset  . Hypertension Mother   . Heart disease Mother        CHF  . Kidney disease Mother        Renal disease  . Cancer Mother        lung cancer  . Fibromyalgia Sister   . COPD Father   . Heart disease Other   . Hypertension Other   . Stroke Other   . Depression Neg Hx   . Alcohol abuse Neg Hx   . Drug abuse Neg Hx   . Colon cancer Neg Hx   . Prostate cancer Neg Hx    Family history: Family history reviewed and not pertinent  Prior to Admission medications   Medication Sig Start Date End Date Taking? Authorizing Provider  lansoprazole (PREVACID)  30 MG capsule Take 30 mg by mouth daily. 07/10/20 07/10/21 Yes [provider]  predniSONE (DELTASONE) 20 MG tablet Take 2 tablets (40 mg total) by mouth daily with breakfast for 28 days. 08/03/20 08/31/20 Yes Vanga, Loel Dubonnet, MD  sertraline (ZOLOFT) 100 MG tablet TAKE 1 TABLET BY MOUTH  DAILY Patient taking differently: Take 100 mg by mouth daily. 06/19/20  Yes Joaquim Nam, MD  aspirin 325 MG tablet Take 325 mg by mouth daily.    [provider]  b complex vitamins tablet Take 1 tablet by mouth daily.    [provider]  HYDROcodone-acetaminophen (NORCO/VICODIN) 5-325 MG tablet Take 1 tablet by mouth every 6 (six) hours as needed for moderate pain or severe pain (for renal stones). Patient not taking: No sig reported 10/26/18   Joaquim Nam, MD  simvastatin (ZOCOR) 40 MG tablet TAKE 1 TABLET BY MOUTH AT  BEDTIME Patient not taking: No sig reported 06/19/20   Joaquim Nam, MD   Physical Exam: Vitals:   08/07/20 1222 08/07/20 1230 08/07/20 1245 08/07/20 1300  BP: 138/71 139/79 124/72 (!) 124/92  Pulse: 60 63 61 63  Resp: 14 16 19  (!) 21  Temp:      TempSrc:      SpO2: 98% 100% 98% 99%  Weight:      Height:       Constitutional: appears age appropriate, NAD, calm, comfortable Eyes: PERRL, lids and conjunctivae normal ENMT: Mucous membranes are moist. Posterior pharynx clear of any exudate or lesions. Age-appropriate dentition. Hearing appropriate Neck: normal, supple, no masses, no thyromegaly Respiratory: clear to auscultation bilaterally, no wheezing, no crackles. Normal respiratory effort. No accessory muscle use.  Cardiovascular: Regular rate and rhythm, no murmurs / rubs / gallops. No extremity edema. 2+ pedal pulses. No carotid bruits.  Abdomen: no tenderness, no masses palpated, no hepatosplenomegaly. Bowel sounds positive.  Musculoskeletal: no clubbing / cyanosis. No joint deformity upper and lower extremities. Good ROM, no contractures, no  atrophy. Normal muscle tone.  Skin: no rashes, lesions, ulcers. No induration, jaundiced Neurologic: Sensation intact. Strength 5/5 in all 4.  Psychiatric: Normal judgment and insight. Alert and oriented x 3. Normal mood.   EKG: not indicated at this time  Chest x-ray on Admission: not indicated  [place] Intraoperative  Result Date: 08/07/2020 CLINICAL DATA:  Ultrasound was provided for use by the ordering physician.  No provider Interpretation or professional fees incurred.    Labs on Admission: I have personally reviewed following labs  CBC: Recent Labs  Lab 08/07/20 1046  WBC 13.5*  HGB 11.7*  HCT 35.1*  MCV 88.0  PLT 337   Basic Metabolic Panel: Recent Labs  Lab 08/07/20 1046  NA 132*  K 4.6  CL 98  CO2 25  GLUCOSE 207*  BUN 21  CREATININE 0.82  CALCIUM 9.3   GFR: Estimated Creatinine Clearance: 105.4 mL/min (by C-G formula based on SCr of 0.82 mg/dL).  Liver Function Tests: Recent Labs  Lab 08/07/20 1046  AST 153*  ALT 214*  ALKPHOS 827*  BILITOT 16.2*  PROT 7.8  ALBUMIN 2.7*   Coagulation Profile: Recent Labs  Lab 08/07/20 1046  INR 1.3*   Urine analysis:    Component Value Date/Time   COLORURINE yellow 04/11/2007 1038   APPEARANCEUR Clear 04/11/2007 1038   LABSPEC 1.020 04/11/2007 1038   PHURINE 6.0 04/11/2007 1038   HGBUR negative 04/11/2007 1038   BILIRUBINUR Neg 11/27/2018 1439   PROTEINUR Negative 11/27/2018 1439   UROBILINOGEN 0.2 11/27/2018 1439   UROBILINOGEN 0.2 04/11/2007 1038   NITRITE Neg 11/27/2018 1439   NITRITE negative 04/11/2007 1038   LEUKOCYTESUR Negative 11/27/2018 1439   Rayleen Wyrick N Bijan Ridgley D.O. [facility]  If 7PM-7AM, please contact overnight-coverage provider If 7AM-7PM, please contact day coverage provider [email or web address]  08/07/2020, 1:18 PM

## 2020-08-07 NOTE — Progress Notes (Addendum)
Notified Wannetta Sender NP of critical lab value: lactic acid 2.1

## 2020-08-07 NOTE — Progress Notes (Signed)
Notified Amy Cox DO of critical lactic 2.1

## 2020-08-07 NOTE — Discharge Instructions (Signed)
Biliary Drainage Catheter Home Guide  A biliary drainage catheter is a thin, flexible tube that is inserted through your skin into the bile ducts in your liver. Bile is a thick yellow or green fluid that helps digest fat in foods. The purpose of a biliary drainage catheter is to keep bile from backing up into your liver.  There are three kinds of biliary drainage catheter systems. Follow general guidelines and instructions for your specific drainage system. Your health care provider will explain how to:  · Inspect your drainage catheter.  · Flush your drainage catheter.  · Attach and empty your collection bag.  · Change your bandage (dressing).  What are the risks?  The biliary drainage catheter system is generally safe. However, problems may occur, including:  · Infection.  · Bleeding.  · A dislodged or blocked catheter.  Supplies needed:  Supplies you will need to change your dressing:  · Mild soap and warm water.  · A clean cloth.  · Split gauze pads, 4 x 4 inches (10 x 10 cm) to use as a dressing sponge.  · Gauze pads, 4 x 4 inches (10 x 10 cm), or adhesive dressing cover.  · Paper tape.  Supplies you will need to flush your drainage catheter:  · Alcohol swab.  · 10 mL prefilled normal salt-water (saline) syringe.  Supplies you will need to empty your collection bag:  · Drainage container.  · Tissue or disposable napkin.  · Measuring container, if needed.  How to care for your drainage catheter  How to inspect your drainage catheter  1. Check your dressing to make sure that it is dry and clean. Change your dressing if it comes loose or gets wet or dirty.  2. Check your collection bag to make sure that drainage fluid is flowing into the bag well. Note the color and amount compared with the color and amount on other days.  3. Check your drainage catheter and bag for any cracks or kinks in the tubing.  How to flush your drainage catheter  Biliary drainagecatheters should be flushed daily, or as often as told by  your health care provider. The end of your drainage catheter is closed using an IV cap. You can connect the syringe directly to the IV cap.  1. Wash your hands with soap and water for at least 20 seconds.  2. If your drainage catheter has an external valve (stopcock) attached to it, turn the stopcock toward the collection bag. This will allow the saline to flow in the direction of your body.  3. Clean the IV cap with an alcohol swab.  4. Screw the tip of a 10 mL normal saline syringe onto the IV cap.  5. Inject the saline for 5-10 seconds. If you feel resistance while injecting, stop right away. Do not pull back on the plunger. Pulling back on the plunger could increase your risk of infection.  6. Remove the syringe from the cap. Turn the stopcock so that fluid flows from your body into your collection bag. You may notice more fluid flowing into the bag after you have completed the flush.  How to attach a bag to your drainage catheter  If you are having trouble with your internal biliary drain, your health care provider may direct you to use bag drainage until you can be seen to fix the problem. For this reason, you should always have a collection bag and connecting tubing at home. If you do not have   these supplies, remember to ask for them at your next appointment.  1. Remove the bag and the connecting tubing from their packaging.  2. Connect the funnel end of the tubing to the bag's cone-shaped stem.  3. Remove the IV cap from the biliary drain. To do this, unscrew the cap and replace it with the screw-on end of the tubing.  4. Save the IV cap in a plastic storage bag that can be sealed.  How to empty your collection bag  Empty the collection bag whenever it becomes ? full. Also empty it before you go to sleep. Most collection bags have a drainage valve at the bottom that allows them to be emptied easily.  1. Wash your hands with soap and water for at least 20 seconds.  2. Hold the collection bag over the toilet,  basin, or collection container. Use a measuring container if your health care provider told you to measure the drainage.  3. Unscrew the valve to open it, and allow the bag to drain.  4. Close the valve securely to avoid leakage.  5. Use a tissue or disposable napkin to wipe the valve clean.  6. Measure the amount of drainage and then dispose of the fluid in the toilet.  7. Wash the measuring container with soap and water.  8. Record the amount of drainage as told.  How to change your dressing  The dressing over your drainage catheter may be changed weekly, or more often if needed to keep the dressing dry and clean. Your health care provider will tell you how often to change your dressing.  1. Wash your hands with soap and water for at least 20 seconds.  2. Gently remove your old dressing. Avoid using scissors to remove the dressing because they may damage the drainage catheter.  3. Wash the skin around your insertion site with mild soap and warm water. Then, rinse well and pat the area dry with a clean cloth.  4. Check the skin around your drainage catheter for redness or swelling, or for yellow or green fluid that has a bad smell. Also check for a rash, warmth, or skin breakdown.  5. If your drainage catheter was stitched (sutured) to your skin, inspect the suture to make sure it is still anchored in your skin.  6. Do not apply creams, ointments, or alcohol to the site. Allow your skin to air-dry completely before you apply a new dressing.  7. Place your drainage catheter through the slit in a dressing sponge. The dressing sponge should slide under the disk that holds the drainage catheter in place.  8. Cover the drainage catheter and the dressing sponge.  ? Cover the catheter and sponge with a 4 x 4 inch (10 x 10 cm) gauze. The drainage catheter should rest on the gauze and not on your skin. Then tape the dressing to your skin.  ? If told, use an adhesive dressing covering over the top of the dressing in place of  the gauze and tape.  9. Wash your hands with soap and water for at least 20 seconds.  Follow these instructions at home  Keep all follow-up visits as told by your health care provider. This is important.  Contact a health care provider if:  · Your pain gets worse after it had improved, and it is not relieved with pain medicines.  · You have any questions about caring for your drainage catheter or collection bag.  · The skin around your   catheter insertion site breaks down.  · You have any of these signs of infection around your catheter insertion site:  ? More redness, swelling, or pain.  ? Fluid or blood.  ? Warmth.  ? Pus or a bad smell.  Get help right away if:  · You have a fever or chills.  · You have bile leaking around your drainage catheter.  · Your drainage catheter becomes blocked or clogged.  · Your drainage catheter comes out.  Summary  · Bile is a thick yellow or green fluid that helps digest fat in foods. The purpose of a biliary drainage catheter is to keep bile from backing up into your liver.  · Check the skin around your drainage catheter for redness or swelling, or for yellow or green fluid that has a bad smell. Also check for a rash or skin breakdown.  · Biliary drainagecatheters should be flushed daily, or as often as told by your health care provider.  · Empty the collection bag whenever it becomes ? full. Also empty it before you go to sleep.  This information is not intended to replace advice given to you by your health care provider. Make sure you discuss any questions you have with your health care provider.  Document Revised: 07/23/2019 Document Reviewed: 03/20/2019  [employer] Patient Education © 2021 [employer]

## 2020-08-07 NOTE — Plan of Care (Signed)
°  Problem: Education: °Goal: Knowledge of General Education information will improve °Description: Including pain rating scale, medication(s)/side effects and non-pharmacologic comfort measures °Outcome: Progressing °  °Problem: Health Behavior/Discharge Planning: °Goal: Ability to manage health-related needs will improve °Outcome: Progressing °  °Problem: Clinical Measurements: °Goal: Ability to maintain clinical measurements within normal limits will improve °Outcome: Progressing °Goal: Will remain free from infection °Outcome: Progressing °Goal: Diagnostic test results will improve °Outcome: Progressing °Goal: Respiratory complications will improve °Outcome: Progressing °Goal: Cardiovascular complication will be avoided °Outcome: Progressing °  °Problem: Coping: °Goal: Level of anxiety will decrease °Outcome: Progressing °  °Problem: Nutrition: °Goal: Adequate nutrition will be maintained °Outcome: Progressing °  °Problem: Activity: °Goal: Risk for activity intolerance will decrease °Outcome: Progressing °  °Problem: Elimination: °Goal: Will not experience complications related to bowel motility °Outcome: Progressing °Goal: Will not experience complications related to urinary retention °Outcome: Progressing °  °Problem: Pain Managment: °Goal: General experience of comfort will improve °Outcome: Progressing °  °Problem: Safety: °Goal: Ability to remain free from injury will improve °Outcome: Progressing °  °Problem: Skin Integrity: °Goal: Risk for impaired skin integrity will decrease °Outcome: Progressing °  °

## 2020-08-07 NOTE — H&P (Addendum)
Chief Complaint: Patient was seen in consultation today for perc biliary drain at the request of [place]  Referring Physician(s): Wohl,Darren  Supervising Physician: Ruthann Cancer  Patient Status: [facility] - Out-pt  History of Present Illness: Dennis Lambert is a 65 y.o. 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 Had unsuccessful attempt at ERCP a few days ago and is now scheduled for PTC with biliary drainage. PMHx, meds, labs, imaging, allergies reviewed. Feels well, no recent fevers, chills, illness. Has been NPO today as directed.    Past Medical History:  Diagnosis Date  . Dysthymic disorder    and mood swings controlled on zoloft  . GERD (gastroesophageal reflux disease)   . Hyperlipidemia   . Hypertension   . Renal stone     Past Surgical History:  Procedure Laterality Date  . CHOLECYSTECTOMY  04/28/01   Hassell Done)  . ERCP N/A 08/04/2020   Procedure: ENDOSCOPIC RETROGRADE CHOLANGIOPANCREATOGRAPHY (ERCP);  Surgeon: Lucilla Lame, MD;  Location: [facility] ENDOSCOPY;  Service: Endoscopy;  Laterality: N/A;  . Hepatobiliary scan  04/17/01   +  . UPPER GASTROINTESTINAL ENDOSCOPY  ~94   Reflux    Allergies: Amoxicillin, Clarithromycin, Codeine sulfate, Flomax [tamsulosin], Iodine, and Shellfish allergy  Medications: Prior to Admission medications   Medication Sig Start Date End Date Taking? Authorizing Provider  aspirin 325 MG tablet Take 325 mg by mouth daily.    [provider]  b complex vitamins tablet Take 1 tablet by mouth daily.    [provider]  HYDROcodone-acetaminophen (NORCO/VICODIN) 5-325 MG tablet Take 1 tablet by mouth every 6 (six) hours as needed for moderate pain or severe pain (for renal stones). Patient not taking: No sig reported 10/26/18   Tonia Ghent, MD  lansoprazole (PREVACID) 30 MG capsule Take 30 mg by mouth daily. 07/10/20 07/10/21  [provider]   predniSONE (DELTASONE) 20 MG tablet Take 2 tablets (40 mg total) by mouth daily with breakfast for 28 days. 08/03/20 08/31/20  Lin Landsman, MD  sertraline (ZOLOFT) 100 MG tablet TAKE 1 TABLET BY MOUTH  DAILY Patient taking differently: Take 100 mg by mouth daily. 06/19/20   Tonia Ghent, MD  simvastatin (ZOCOR) 40 MG tablet TAKE 1 TABLET BY MOUTH AT  BEDTIME Patient not taking: No sig reported 06/19/20   Tonia Ghent, MD     Family History  Problem Relation Age of Onset  . Hypertension Mother   . Heart disease Mother        CHF  . Kidney disease Mother        Renal disease  . Cancer Mother        lung cancer  . Fibromyalgia Sister   . COPD Father   . Heart disease Other   . Hypertension Other   . Stroke Other   . Depression Neg Hx   . Alcohol abuse Neg Hx   . Drug abuse Neg Hx   . Colon cancer Neg Hx   . Prostate cancer Neg Hx     Social History   Socioeconomic History  . Marital status: Married    Spouse name: Not on file  . Number of children: 1  . Years of education: Not on file  . Highest education level: Not on file  Occupational History  . 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 Smoking status: Never Smoker  . Smokeless tobacco: Never Used  Vaping Use  . Vaping Use: Never used  Substance and Sexual Activity  . Alcohol use: No  . Drug use: No  . Sexual activity: Not on file  Other Topics Concern  . Not on file  Social History Narrative   From [place]   Married 1983   1 son   Prev with [employer]   Retired as of 2018   Social Determinants of Radio broadcast assistant Strain: Not on [employer] Insecurity: Not on file  Transportation Needs: Not on file  Physical Activity: Not on file  Stress: Not on file  Social Connections: Not on file    Review of Systems: A 12 point ROS discussed and pertinent positives are  indicated in the HPI above.  All other systems are negative.  Review of Systems  Vital Signs: BP 128/74   Pulse 65   Temp 97.7 F (36.5 C) (Oral)   Resp 14   Ht $R'5\' 10"'yB$  (1.778 m)   Wt 95.3 kg   SpO2 98%   BMI 30.13 kg/m   Physical Exam  Imaging: CT ABDOMEN PELVIS WO CONTRAST  Result Date: 07/15/2020 CLINICAL DATA:  65 year old male with history of epigastric pain for 1 week with constipation. Elevated liver function tests. EXAM: CT ABDOMEN AND PELVIS WITHOUT CONTRAST TECHNIQUE: Multidetector CT imaging of the abdomen and pelvis was performed following the standard protocol without IV contrast. COMPARISON:  No priors. FINDINGS: Lower chest: Unremarkable. Hepatobiliary: No suspicious cystic or solid hepatic lesions are confidently identified on today's noncontrast CT examination. Status post cholecystectomy. Pancreas: No definite pancreatic mass or peripancreatic fluid collections. Diffuse haziness in the peripancreatic fat, concerning for acute pancreatitis. Spleen: Unremarkable. Adrenals/Urinary Tract: 3 mm nonobstructive calculus in the upper pole collecting system of the right kidney. No additional calculi are noted within the left renal collecting system, along the course of either ureter, or within the lumen of the urinary bladder. No hydroureteronephrosis. Urinary bladder is normal in appearance. Bilateral adrenal glands are normal in appearance. Stomach/Bowel: The appearance of the stomach is normal. There is no pathologic dilatation of small bowel or colon. Normal appendix. Vascular/Lymphatic: No atherosclerotic calcifications in the abdominal aorta or pelvic vasculature. No lymphadenopathy noted in the abdomen or pelvis. Reproductive: Prostate gland is either diminutive or surgically absent. Seminal vesicles are unremarkable in appearance. Other: No significant volume of ascites.  No pneumoperitoneum. Musculoskeletal: Bilateral pars defects at L5 with 13 mm of anterolisthesis of L5 upon  S1. There are no aggressive appearing lytic or blastic lesions noted in the visualized portions of the skeleton. IMPRESSION: 1. Peripancreatic inflammatory changes concerning for an acute pancreatitis. Correlation with lipase levels is recommended. 2. 3 mm nonobstructive calculus in the upper pole collecting system of the right kidney. No ureteral stones or findings of urinary tract obstruction are noted at this time. 3. Grade 2 spondylolisthesis of L5 upon S1. Electronically Signed   By: Vinnie Langton M.D.   On: 07/15/2020 11:50   MR 3D Recon At Scanner  Result Date: 07/22/2020 CLINICAL DATA:  Elevated bilirubin. EXAM: MRI ABDOMEN WITHOUT AND WITH CONTRAST (INCLUDING MRCP) TECHNIQUE: Multiplanar multisequence MR imaging of the abdomen was performed both before and after the administration of intravenous contrast. Heavily T2-weighted images of the biliary and pancreatic ducts were obtained, and three-dimensional MRCP images were rendered by post processing. CONTRAST:  58mL GADAVIST GADOBUTROL 1 MMOL/ML IV SOLN COMPARISON:  CT  scan 07/15/2020 FINDINGS: Lower chest: Unremarkable. Hepatobiliary: Intra and extrahepatic biliary duct dilatation noted. The intrahepatic biliary duct dilatation is at the level of the left and right hepatic duct confluence. Postcontrast imaging shows some enhancement at the level of the left and right ductal cut offs with some possible ductal wall thickening but no discrete or measurable mass lesion. This finding is well demonstrated on 3 minutes postcontrast delayed axial image 35 of series 24. There is a focal short segment stricture in the posterior left hepatic ducts which continue on into the common common duct. Common bile duct measures 11 mm diameter and is abruptly stenotic in the pancreatic head with a subtle hypointense wall on T2 imaging. Gallbladder is surgically absent. Pancreas: Pancreas appears diffusely thickened and ill-defined, similar to recent CT. There is not a  substantial amount of peripancreatic edema although a subtle peripheral rim of T2 hypointensity is seen most prominently in the region of the pancreatic tail with there is some obscuration of the main pancreatic duct. The main pancreatic duct is nondilated but terminates abruptly in the head of pancreas. Early postcontrast imaging shows a small 6 mm nodular focus of hyperenhancement in the head of pancreas, in the region of the ampulla (see postcontrast 23 second image 56 of series 17 with subtraction image of the same series 56/18). Pancreatic head has a nodular, masslike contour with mildly heterogeneous attenuation and heterogeneous enhancement but no restricted diffusion. Again there is no associated dilatation of the main pancreatic duct. Spleen:  No splenomegaly. No focal mass lesion. Adrenals/Urinary Tract: No adrenal nodule or mass. Central sinus cysts noted left kidney. Kidneys otherwise unremarkable. Stomach/Bowel: Stomach is unremarkable. No gastric wall thickening. No evidence of outlet obstruction. Duodenum is normally positioned as is the ligament of Treitz. No small bowel or colonic dilatation within the visualized abdomen. Vascular/Lymphatic: No abdominal aortic aneurysm. 18 mm hepatoduodenal ligament lymph node is enlarged. No other abdominal lymphadenopathy evident. Other:  No intraperitoneal free fluid. Musculoskeletal: No focal suspicious marrow enhancement within the visualized bony anatomy. IMPRESSION: 1. Intra and extrahepatic biliary duct dilatation with abrupt cut off of both the left and right hepatic ducts at the level of confluence. There is some possible ductal wall thickening and hyperenhancement in the region of the stricture, but no measurable mass lesion. There is also a focal stricture in the posterior left hepatic ducts and another area of abrupt stricture of the common bile duct in the head of pancreas. These findings are associated with a pancreas that appears diffusely thickened  and areas of heterogeneous parenchyma and heterogeneous enhancement (most notably in the tail where there is an apparent T2 hypointense rim on MR today). There is variable, segmental narrowing/stricture of the main pancreatic duct without associated dilated segments. Overall, imaging features are suspicious for autoimmune pancreatitis. Endoscopic ultrasound recommended to further evaluate and definitively exclude pancreatic head mass given the nodular masslike configuration of the pancreatic head and associated small nodular focus of hyperenhancement in the head of pancreas adjacent to or at the ampulla. Given the pancreatic involvement, primary sclerosing cholangitis is considered less likely. 2. 18 mm hepatoduodenal ligament lymph node is enlarged. Electronically Signed   By: Misty Stanley M.D.   On: 07/22/2020 12:03   DG C-Arm 1-60 Min-No Report  Result Date: 08/04/2020 Fluoroscopy was utilized by the requesting physician.  No radiographic interpretation.   MR ABDOMEN MRCP W WO CONTAST  Result Date: 07/22/2020 CLINICAL DATA:  Elevated bilirubin. EXAM: MRI ABDOMEN WITHOUT AND WITH CONTRAST (INCLUDING  MRCP) TECHNIQUE: Multiplanar multisequence MR imaging of the abdomen was performed both before and after the administration of intravenous contrast. Heavily T2-weighted images of the biliary and pancreatic ducts were obtained, and three-dimensional MRCP images were rendered by post processing. CONTRAST:  7mL GADAVIST GADOBUTROL 1 MMOL/ML IV SOLN COMPARISON:  CT scan 07/15/2020 FINDINGS: Lower chest: Unremarkable. Hepatobiliary: Intra and extrahepatic biliary duct dilatation noted. The intrahepatic biliary duct dilatation is at the level of the left and right hepatic duct confluence. Postcontrast imaging shows some enhancement at the level of the left and right ductal cut offs with some possible ductal wall thickening but no discrete or measurable mass lesion. This finding is well demonstrated on 3 minutes  postcontrast delayed axial image 35 of series 24. There is a focal short segment stricture in the posterior left hepatic ducts which continue on into the common common duct. Common bile duct measures 11 mm diameter and is abruptly stenotic in the pancreatic head with a subtle hypointense wall on T2 imaging. Gallbladder is surgically absent. Pancreas: Pancreas appears diffusely thickened and ill-defined, similar to recent CT. There is not a substantial amount of peripancreatic edema although a subtle peripheral rim of T2 hypointensity is seen most prominently in the region of the pancreatic tail with there is some obscuration of the main pancreatic duct. The main pancreatic duct is nondilated but terminates abruptly in the head of pancreas. Early postcontrast imaging shows a small 6 mm nodular focus of hyperenhancement in the head of pancreas, in the region of the ampulla (see postcontrast 23 second image 56 of series 17 with subtraction image of the same series 56/18). Pancreatic head has a nodular, masslike contour with mildly heterogeneous attenuation and heterogeneous enhancement but no restricted diffusion. Again there is no associated dilatation of the main pancreatic duct. Spleen:  No splenomegaly. No focal mass lesion. Adrenals/Urinary Tract: No adrenal nodule or mass. Central sinus cysts noted left kidney. Kidneys otherwise unremarkable. Stomach/Bowel: Stomach is unremarkable. No gastric wall thickening. No evidence of outlet obstruction. Duodenum is normally positioned as is the ligament of Treitz. No small bowel or colonic dilatation within the visualized abdomen. Vascular/Lymphatic: No abdominal aortic aneurysm. 18 mm hepatoduodenal ligament lymph node is enlarged. No other abdominal lymphadenopathy evident. Other:  No intraperitoneal free fluid. Musculoskeletal: No focal suspicious marrow enhancement within the visualized bony anatomy. IMPRESSION: 1. Intra and extrahepatic biliary duct dilatation with  abrupt cut off of both the left and right hepatic ducts at the level of confluence. There is some possible ductal wall thickening and hyperenhancement in the region of the stricture, but no measurable mass lesion. There is also a focal stricture in the posterior left hepatic ducts and another area of abrupt stricture of the common bile duct in the head of pancreas. These findings are associated with a pancreas that appears diffusely thickened and areas of heterogeneous parenchyma and heterogeneous enhancement (most notably in the tail where there is an apparent T2 hypointense rim on MR today). There is variable, segmental narrowing/stricture of the main pancreatic duct without associated dilated segments. Overall, imaging features are suspicious for autoimmune pancreatitis. Endoscopic ultrasound recommended to further evaluate and definitively exclude pancreatic head mass given the nodular masslike configuration of the pancreatic head and associated small nodular focus of hyperenhancement in the head of pancreas adjacent to or at the ampulla. Given the pancreatic involvement, primary sclerosing cholangitis is considered less likely. 2. 18 mm hepatoduodenal ligament lymph node is enlarged. Electronically Signed   By: Verda Cumins.D.  On: 07/22/2020 12:03    Labs:  CBC: Recent Labs    08/07/20 1046  WBC 13.5*  HGB 11.7*  HCT 35.1*  PLT 337    COAGS: Recent Labs    08/07/20 1046  INR 1.3*    BMP: Recent Labs    08/07/20 1046  NA 132*  K 4.6  CL 98  CO2 25  GLUCOSE 207*  BUN 21  CALCIUM 9.3  CREATININE 0.82  GFRNONAA >60    LIVER FUNCTION TESTS: Recent Labs    08/07/20 1046  BILITOT 16.2*  AST 153*  ALT 214*  ALKPHOS 827*  PROT 7.8  ALBUMIN 2.7*    Assessment and Plan: Autoimmune pancreatitis Obstructive jaundice with progressively rising T bilirubin Plan for image guided perc biliary drain with cholangiogram. Pt with potential hx of contrast allergy, has been  premedicated appropriately. Labs pending Risks and benefits of PTC with bilairy drainage discussed with the patient including, but not limited to bleeding, infection which may lead to sepsis or even death and damage to adjacent structures.  This interventional procedure involves the use of X-rays and because of the nature of the planned procedure, it is possible that we will have prolonged use of X-ray fluoroscopy.  Potential radiation risks to you include (but are not limited to) the following: - A slightly elevated risk for cancer  several years later in life. This risk is typically less than 0.5% percent. This risk is low in comparison to the normal incidence of human cancer, which is 33% for women and 50% for men according to the [place]. - Radiation induced injury can include skin redness, resembling a rash, tissue breakdown / ulcers and hair loss (which can be temporary or permanent).   The likelihood of either of these occurring depends on the difficulty of the procedure and whether you are sensitive to radiation due to previous procedures, disease, or genetic conditions.   IF your procedure requires a prolonged use of radiation, you will be notified and given written instructions for further action.  It is your responsibility to monitor the irradiated area for the 2 weeks following the procedure and to notify your physician if you are concerned that you have suffered a radiation induced injury.    All of the patient's questions were answered, patient is agreeable to proceed.  Consent signed and in chart.  Due to the risk of transient sepsis post procedure, plan for admission for observation.  Will also need follow up with primary and GI teams, as well as eventual outpt follow up cholangiogram in several weeks.   Thank you for this interesting consult.  I greatly enjoyed meeting DEJOUR VOS and look forward to participating in their care.  A copy of this report was  sent to the requesting provider on this date.  Electronically Signed: Ascencion Dike, PA-C 08/07/2020, 11:25 AM   I spent a total of 30 minutes in face to face in clinical consultation, greater than 50% of which was counseling/coordinating care for [facility] with biliary drain

## 2020-08-07 NOTE — Progress Notes (Signed)
Received report from [employer] in specials.  Pt transferring to 103 on 1C for continued observation

## 2020-08-07 NOTE — Progress Notes (Signed)
Pt adamantly refuses ordered covid test, AC at bedside, charge nurse made aware.

## 2020-08-07 NOTE — Progress Notes (Signed)
Called report to [place], RN and transported patient via stretcher to room 103.  Face-to face report updated with Rushie Goltz, RN at bedside.

## 2020-08-07 NOTE — Progress Notes (Signed)
Patient brought Prednisone 50 mg tab and Banophen 50mg  tab with him as prescriptions to take prior to procedure and he took both tabs with sip water.

## 2020-08-07 NOTE — Procedures (Signed)
Interventional Radiology Procedure Note  Procedure:  1) Percutaneous cholangiogram 2) Placement of right biliary drain  Findings: Please refer to procedural dictation for full description.  Right anterior biliary radical accessed under ultrasound.  Cholangiogram significant for diffuse marked biliary ductal dilation with ill-defined hilar stricture.  Non-dilated and patent common duct.  Stricture allows passage of contrast and biliary drain with ease.  8.5 Fr internalized biliary drain placed with pigtail portion in duodenum, placed to bag drainage.  Complications: None immediate  Estimated Blood Loss: < 5 mL  Recommendations: Keep to bag drainage. IR will follow. Appreciate overnight observation care by Hospitalist team monitoring for acute cholangitis, nausea/vomiting, hemorrhage.  OK from IR standpoint for discharge home tomorrow morning if H/H stable, bilirubin trending downward, and feeling well.  Return in 6-8 weeks for repeat cholangiogram, possible brush biopsy of hilar stricture.  If there is worsening interval jaundice or abdominal pain, indwelling drain upsize, left sided and possible right posterior biliary drain may be required.   Marliss Coots, MD Pager: [phone]

## 2020-08-08 DIAGNOSIS — R17 Unspecified jaundice: Secondary | ICD-10-CM | POA: Diagnosis not present

## 2020-08-08 DIAGNOSIS — K861 Other chronic pancreatitis: Secondary | ICD-10-CM | POA: Diagnosis not present

## 2020-08-08 DIAGNOSIS — D72829 Elevated white blood cell count, unspecified: Secondary | ICD-10-CM | POA: Diagnosis not present

## 2020-08-08 DIAGNOSIS — K831 Obstruction of bile duct: Secondary | ICD-10-CM

## 2020-08-08 LAB — CBC
HCT: 32.8 % — ABNORMAL LOW (ref 39.0–52.0)
Hemoglobin: 11.1 g/dL — ABNORMAL LOW (ref 13.0–17.0)
MCH: 29.4 pg (ref 26.0–34.0)
MCHC: 33.8 g/dL (ref 30.0–36.0)
MCV: 87 fL (ref 80.0–100.0)
Platelets: 299 10*3/uL (ref 150–400)
RBC: 3.77 MIL/uL — ABNORMAL LOW (ref 4.22–5.81)
RDW: 22.6 % — ABNORMAL HIGH (ref 11.5–15.5)
WBC: 13.5 10*3/uL — ABNORMAL HIGH (ref 4.0–10.5)
nRBC: 0 % (ref 0.0–0.2)

## 2020-08-08 LAB — BASIC METABOLIC PANEL
Anion gap: 6 (ref 5–15)
BUN: 22 mg/dL (ref 8–23)
CO2: 28 mmol/L (ref 22–32)
Calcium: 9.2 mg/dL (ref 8.9–10.3)
Chloride: 99 mmol/L (ref 98–111)
Creatinine, Ser: 0.91 mg/dL (ref 0.61–1.24)
GFR, Estimated: >60 mL/min (ref >60)
Glucose, Bld: 129 mg/dL — ABNORMAL HIGH (ref 70–99)
Potassium: 4.5 mmol/L (ref 3.5–5.1)
Sodium: 133 mmol/L — ABNORMAL LOW (ref 135–145)

## 2020-08-08 LAB — HEPATIC FUNCTION PANEL
ALT: 156 U/L — ABNORMAL HIGH (ref 0–44)
AST: 66 U/L — ABNORMAL HIGH (ref 15–41)
Albumin: 2.4 g/dL — ABNORMAL LOW (ref 3.5–5.0)
Alkaline Phosphatase: 658 U/L — ABNORMAL HIGH (ref 38–126)
Bilirubin, Direct: 4.7 mg/dL — ABNORMAL HIGH (ref 0.0–0.2)
Indirect Bilirubin: 4 mg/dL — ABNORMAL HIGH (ref 0.3–0.9)
Total Bilirubin: 8.7 mg/dL — ABNORMAL HIGH (ref 0.3–1.2)
Total Protein: 6.7 g/dL (ref 6.5–8.1)

## 2020-08-08 MED ORDER — PREDNISONE 20 MG PO TABS
40.0000 mg | ORAL_TABLET | Freq: Once | ORAL | Status: AC
  Administered 2020-08-08: 40 mg via ORAL
  Filled 2020-08-08: qty 2

## 2020-08-08 MED ORDER — ACETAMINOPHEN 325 MG PO TABS: 325.0000 mg | ORAL_TABLET | Freq: Four times a day (QID) | ORAL | Status: DC | PRN

## 2020-08-08 MED ORDER — ASPIRIN 325 MG PO TABS: 325.0000 mg | ORAL_TABLET | Freq: Every day | ORAL | Status: DC

## 2020-08-08 NOTE — Plan of Care (Signed)
End of Shift Summary:  Alert and oriented x4. SB on tele, other VSS. Afebrile. Remained on room air sats >93%. Jaundiced sclerae and skin. Bili drain patent and draining appropriately to gravity. UOP adequate, bathroom privileges. (+) flatus, (+) BM, regular consistency per pt. Denies pain or n/v. Remained free from falls or injury. Airborne/Contact precautions maintained. Urinalysis sent. Wife at bedside. Call bell within reach and able to use.

## 2020-08-08 NOTE — Discharge Summary (Signed)
DISCHARGE SUMMARY  Dennis Lambert  MR#: [id number]  DOB:1955-11-27  Date of Admission: 08/07/2020 Date of Discharge: 08/08/2020  Attending Physician:Dannetta Lekas Silvestre Gunner, MD  Patient's IHK:[id number], Duane Lope, MD  Consults: IR  Disposition: D/C home    Follow-up Appts:  Follow-up Information    Suttle, Thressa Sheller, MD Follow up.   Specialties: Interventional Radiology, Diagnostic Radiology, Radiology Why: Office will call you to set up follow up. Contact information: [address] [place] [address] [phone]        Marguarite Arbour, MD In 1 week.   Specialty: Internal Medicine Why: patient to make own follow up appointment Contact information: [address] [facility] [place] [address] [phone]               Discharge Diagnoses: Autoimmune pancreatitis Obstructive jaundice status post percutaneous biliary drain HLD Anxiety/depression  Initial presentation: 65 year old with a history of GERD, anxiety/depression, and HLD who presented to IR as an outpatient to undergo percutaneous cholangiogram with possible biliary drain placement.  He was previously found to be suffering with autoimmune pancreatitis and consistently rising LFTs with obstructive jaundice.  An outpatient ERCP was accomplished by Dr. Servando Snare 08/04/2020 but was unsuccessful and dilating the ampulla.  As a result the percutaneous IR procedure was scheduled.  The IR procedure was able to be accomplished 08/07/2020 but while observing the patient post procedure there was concern for the possibility of developing sepsis.  Hospital Course: 2/22 unsuccessful outpatient ERCP 2/25 admit after percutaneous biliary drain in IR 2/26 d/c home in improved condition   The patient was followed closely in the acute units post procedure.  Fortunately he thrived.  The morning following his procedure his LFTs were improving, his bilirubin was markedly improved, and he was able to  tolerate a diet without difficulty.  He was cleared for discharge home with follow-up as previously arranged per IR and his Gastroenterologist.  Allergies as of 08/08/2020      Reactions   Amoxicillin    REACTION: Mouth Swelling- this is questionable and was discussed 10/11/11.     Clarithromycin    REACTION: GI Upset   Codeine Sulfate    REACTION: N \\T \ V   Flomax [tamsulosin] Other (See Comments)   Patient can tolerate but noted to be lightheaded with use.  Not an allergy.    Iodine Other (See Comments)   Pt states he is allergic to iodine with a unknown reaction. Pt states allergy happened many years ago.  Pt CT scan is for WO IV contrast and is drinking Barium for oral contrast.    Shellfish Allergy    Prev with shellfish intolerance but able to eat all seafood as of 03/2016.        Medication List    TAKE these medications   acetaminophen 325 MG tablet Commonly known as: TYLENOL Take 1 tablet (325 mg total) by mouth every 6 (six) hours as needed for mild pain, fever or headache (or Fever >/= 101).   aspirin 325 MG tablet Take 1 tablet (325 mg total) by mouth daily. Start taking on: August 12, 2020 What changed: These instructions start on August 12, 2020. If you are unsure what to do until then, ask your doctor or other care provider.   b complex vitamins tablet Take 1 tablet by mouth daily.   lansoprazole 30 MG capsule Commonly known as: PREVACID Take 30 mg by mouth daily.   predniSONE 20 MG tablet Commonly known  as: DELTASONE Take 2 tablets (40 mg total) by mouth daily with breakfast for 28 days.   sertraline 100 MG tablet Commonly known as: ZOLOFT TAKE 1 TABLET BY MOUTH  DAILY       Day of Discharge BP 113/81 (BP Location: Right Arm)   Pulse (!) 55   Temp 97.8 F (36.6 C)   Resp 16   Ht 5\' 10"  (1.778 m)   Wt 95.3 kg   SpO2 99%   BMI 30.13 kg/m   Physical Exam: General: No acute respiratory distress - jaundice which pt reports is improved significantly   Lungs: Clear to auscultation bilaterally without wheezes or crackles Cardiovascular: Regular rate and rhythm without murmur gallop or rub normal S1 and S2 Abdomen: Nontender, nondistended, soft, bowel sounds positive, no rebound, no ascites, no appreciable mass Extremities: No significant cyanosis, clubbing, or edema bilateral lower extremities  Basic Metabolic Panel: Recent Labs  Lab 08/07/20 1046 08/08/20 0544  NA 132* 133*  K 4.6 4.5  CL 98 99  CO2 25 28  GLUCOSE 207* 129*  BUN 21 22  CREATININE 0.82 0.91  CALCIUM 9.3 9.2    Liver Function Tests: Recent Labs  Lab 08/07/20 1046 08/08/20 0544  AST 153* 66*  ALT 214* 156*  ALKPHOS 827* 658*  BILITOT 16.2* 8.7*  PROT 7.8 6.7  ALBUMIN 2.7* 2.4*    Coags: Recent Labs  Lab 08/07/20 1046  INR 1.3*    CBC: Recent Labs  Lab 08/07/20 1046 08/08/20 0544  WBC 13.5* 13.5*  HGB 11.7* 11.1*  HCT 35.1* 32.8*  MCV 88.0 87.0  PLT 337 299    Recent Results (from the past 240 hour(s))  SARS CORONAVIRUS 2 (TAT 6-24 HRS) Nasopharyngeal Nasopharyngeal Swab     Status: None   Collection Time: 08/03/20  8:36 AM   Specimen: Nasopharyngeal Swab  Result Value Ref Range Status   SARS Coronavirus 2 NEGATIVE NEGATIVE Final    Comment: (NOTE) SARS-CoV-2 target nucleic acids are NOT DETECTED.  The SARS-CoV-2 RNA is generally detectable in upper and lower respiratory specimens during the acute phase of infection. Negative results do not preclude SARS-CoV-2 infection, do not rule out co-infections with other pathogens, and should not be used as the sole basis for treatment or other patient management decisions. Negative results must be combined with clinical observations, patient history, and epidemiological information. The expected result is Negative.  Fact Sheet for Patients: 08/05/20  Fact Sheet for Healthcare Providers: HairSlick.no  This test is not yet  approved or cleared by the quierodirigir.com FDA and  has been authorized for detection and/or diagnosis of SARS-CoV-2 by FDA under an Emergency Use Authorization (EUA). This EUA will remain  in effect (meaning this test can be used) for the duration of the COVID-19 declaration under Se ction 564(b)(1) of the Act, 21 U.S.C. section 360bbb-3(b)(1), unless the authorization is terminated or revoked sooner.  Performed at [facility], [address] [place], [place]   CULTURE, BLOOD (ROUTINE X 2) w Reflex to ID Panel     Status: None (Preliminary result)   Collection Time: 08/07/20  5:23 PM   Specimen: BLOOD  Result Value Ref Range Status   Specimen Description BLOOD BLOOD LEFT ARM  Final   Special Requests   Final    BOTTLES DRAWN AEROBIC AND ANAEROBIC Blood Culture adequate volume   Culture   Final    NO GROWTH < 24 HOURS Performed at [facility], [address] [place], [place] [address]    Report Status PENDING  Incomplete  CULTURE, BLOOD (ROUTINE X 2) w Reflex to ID Panel     Status: None (Preliminary result)   Collection Time: 08/07/20  5:23 PM   Specimen: BLOOD  Result Value Ref Range Status   Specimen Description BLOOD BLOOD RIGHT HAND  Final   Special Requests   Final    BOTTLES DRAWN AEROBIC AND ANAEROBIC Blood Culture adequate volume   Culture   Final    NO GROWTH < 24 HOURS Performed at [facility], [address] [place], [place] [address]    Report Status PENDING  Incomplete      Time spent in discharge (includes decision making & examination of pt): 30 minutes  08/08/2020, 3:37 PM   Lonia Blood, MD [facility] Office  [phone]

## 2020-08-08 NOTE — Progress Notes (Signed)
Received MD order to dshcahrge patient to home, reviewed home meds, and new med orders, follow up appointments and bilary care with patient and patient verbalized understanding.

## 2020-08-09 ENCOUNTER — Encounter: Payer: Self-pay | Admitting: Gastroenterology

## 2020-08-12 LAB — CULTURE, BLOOD (ROUTINE X 2)
Culture: NO GROWTH
Culture: NO GROWTH
Special Requests: ADEQUATE
Special Requests: ADEQUATE

## 2020-08-13 ENCOUNTER — Telehealth: Payer: Self-pay

## 2020-08-13 ENCOUNTER — Encounter: Payer: Self-pay | Admitting: Gastroenterology

## 2020-08-13 DIAGNOSIS — R945 Abnormal results of liver function studies: Secondary | ICD-10-CM | POA: Insufficient documentation

## 2020-08-13 DIAGNOSIS — K831 Obstruction of bile duct: Secondary | ICD-10-CM

## 2020-08-13 DIAGNOSIS — K861 Other chronic pancreatitis: Secondary | ICD-10-CM

## 2020-08-13 DIAGNOSIS — R7989 Other specified abnormal findings of blood chemistry: Secondary | ICD-10-CM | POA: Insufficient documentation

## 2020-08-13 NOTE — Telephone Encounter (Signed)
Order MRCP and called central scheduled and got patient to scheduled for 09/09/2020 at 10:00am check in at 9:30am. Nothing to eat or drink 4 hours prior. Called patient and left a message for call back

## 2020-08-13 NOTE — Telephone Encounter (Signed)
-----   Message from Toney Reil, MD sent at 08/13/2020 11:17 AM EST ----- Regarding: Imaging Please schedule MRI/MRCP for follow up of autoimmune pancreatitis, jaundice end of march 2022  Thanks RV

## 2020-08-17 ENCOUNTER — Encounter: Payer: Self-pay | Admitting: Gastroenterology

## 2020-08-20 ENCOUNTER — Encounter: Payer: Self-pay | Admitting: Gastroenterology

## 2020-08-27 ENCOUNTER — Other Ambulatory Visit: Payer: Self-pay | Admitting: Gastroenterology

## 2020-08-27 ENCOUNTER — Other Ambulatory Visit: Payer: Self-pay

## 2020-08-27 ENCOUNTER — Encounter: Payer: Self-pay | Admitting: Gastroenterology

## 2020-08-27 DIAGNOSIS — K861 Other chronic pancreatitis: Secondary | ICD-10-CM

## 2020-08-27 MED ORDER — PREDNISONE 10 MG PO TABS: ORAL_TABLET | ORAL | 0 refills | Status: DC

## 2020-08-28 ENCOUNTER — Encounter: Payer: Self-pay | Admitting: Gastroenterology

## 2020-08-29 LAB — IGG 4: IgG, Subclass 4: 107 mg/dL — ABNORMAL HIGH (ref 2–96)

## 2020-08-31 ENCOUNTER — Other Ambulatory Visit: Payer: Self-pay | Admitting: Radiology

## 2020-08-31 ENCOUNTER — Telehealth: Payer: Self-pay | Admitting: Gastroenterology

## 2020-08-31 DIAGNOSIS — K831 Obstruction of bile duct: Secondary | ICD-10-CM

## 2020-08-31 NOTE — Telephone Encounter (Signed)
Patient called- having problems with drainage placed in his liver.  Patient says it is not draining properly.  I advised him to call Dr Elby Showers as well who placed the drainage.

## 2020-09-01 NOTE — Telephone Encounter (Signed)
Patient verbalized understanding and has appointment with him tomorrow at 1:00pm

## 2020-09-01 NOTE — Telephone Encounter (Signed)
He has to call Dr Jerrye Noble office regarding drain issues. We don't handle those  RV

## 2020-09-02 ENCOUNTER — Encounter: Payer: Self-pay | Admitting: Radiology

## 2020-09-02 ENCOUNTER — Ambulatory Visit
Admission: RE | Admit: 2020-09-02 | Discharge: 2020-09-02 | Disposition: A | Discharge status: Home / Self Care | Payer: Managed Care, Other (non HMO) | Source: Ambulatory Visit | Attending: Radiology | Admitting: Radiology

## 2020-09-02 ENCOUNTER — Other Ambulatory Visit (HOSPITAL_COMMUNITY): Payer: Self-pay | Admitting: Interventional Radiology

## 2020-09-02 DIAGNOSIS — K831 Obstruction of bile duct: Secondary | ICD-10-CM

## 2020-09-02 HISTORY — PX: IR RADIOLOGIST EVAL & MGMT: IMG5224

## 2020-09-02 NOTE — Progress Notes (Signed)
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 Pt is not flushing drain at home at all After 24 hrs or so without output; he was on treadmill at home and suddenly OP re established. Now over 600 yesterday and over 400 cc so far today Bag has 200 cc of bile in it now.  Scheduled today for injection of drain Pt states he is "allergic to contrast" States reaction is nausea. This started at least 30 yrs ago Was "allergic to seafood with Nausea"--- but has been able to eat seafood for 7 or more yrs now. He is always pre medicated - "no matter what" per pt. Although risk of reaction is low-- after lengthy discussion with pt--- he is not comfortable with injection today without premedication--- even knowing that he is already on Prednisone (30-40 mg daily at this time)  from MD for inflammation.  Offered pt injection or at least flush of drain and he only willing to move ahead with flush today.   Past Medical History:  Diagnosis Date  . Dysthymic disorder    and mood swings controlled on zoloft  . GERD (gastroesophageal reflux disease)   . Hyperlipidemia   . Hypertension   . Renal stone     Past Surgical History:  Procedure Laterality Date  . CHOLECYSTECTOMY  04/28/01   Daphine Deutscher)  . ERCP N/A 08/04/2020   Procedure: ENDOSCOPIC RETROGRADE CHOLANGIOPANCREATOGRAPHY (ERCP);  Surgeon: [name], MD;  Location: [facility] ENDOSCOPY;  Service: Endoscopy;  Laterality: N/A;  . Hepatobiliary scan  04/17/01   +  . IR PERC  CHOLECYSTOSTOMY  08/07/2020  . IR RADIOLOGIST EVAL & MGMT  09/02/2020  . UPPER GASTROINTESTINAL ENDOSCOPY  ~94   Reflux    Allergies: Amoxicillin, Clarithromycin, Codeine sulfate, Flomax [tamsulosin], Iodine, and Shellfish allergy  Medications: Prior to Admission medications   Medication Sig Start Date End Date Taking? Authorizing Provider  acetaminophen (TYLENOL) 325 MG tablet Take 1 tablet (325 mg total) by mouth every 6 (six) hours as needed for mild pain, fever or headache (or Fever >/= 101). 08/08/20   Lonia Blood, MD  aspirin 325 MG tablet Take 1 tablet (325 mg total) by mouth daily. 08/12/20   Lonia Blood, MD  b complex vitamins tablet Take 1 tablet by mouth daily.    [provider]  lansoprazole (PREVACID) 30 MG capsule Take 30 mg by mouth daily. 07/10/20 07/10/21  [provider]  predniSONE (DELTASONE) 10 MG tablet Take 3 tablets (30 mg total) by mouth daily with breakfast for 14 days, THEN 2 tablets (20 mg total) daily with breakfast for 28 days. 08/27/20 10/08/20  Toney Reil, MD  sertraline (ZOLOFT) 100 MG tablet TAKE 1 TABLET BY MOUTH  DAILY Patient taking differently: Take 100 mg by mouth daily. 06/19/20   Joaquim Nam, MD     Family History  Problem Relation Age of Onset  . Hypertension Mother   . Heart disease Mother        CHF  .  Kidney disease Mother        Renal disease  . Cancer Mother        lung cancer  . Fibromyalgia Sister   . COPD Father   . Heart disease Other   . Hypertension Other   . Stroke Other   . Depression Neg Hx   . Alcohol abuse Neg Hx   . Drug abuse Neg Hx   . Colon cancer Neg Hx   . Prostate cancer Neg Hx     Social History   Socioeconomic History  . Marital status: Married    Spouse name: Not on file  . Number of children: 1  . Years of education: Not on file  . Highest education level: Not on file  Occupational History  . 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 Occupation: [employer] - started in 2009  Tobacco Use  . Smoking status: Never Smoker  . Smokeless tobacco: Never Used  Vaping Use  . Vaping Use: Never used  Substance and Sexual Activity  . Alcohol use: No  . Drug use: No  . Sexual activity: Not on file  Other Topics Concern  . Not on file  Social History Narrative   From [place]   Married 1983   1 son   Prev with [employer]   Retired as of 2018   Social Determinants of Corporate investment banker Strain: Not on [employer] Insecurity: Not on file  Transportation Needs: Not on file  Physical Activity: Not on file  Stress: Not on file  Social Connections: Not on file     Vital Signs: BP 120/70   Pulse 60   Temp 97.8 F (36.6 C)   SpO2 98%   Physical Exam Skin:    General: Skin is warm.     Comments: Site is clean and dry NT no bleeding Drain intact OP 200 cc in bag; golden bile color Flushes easily with 10 cc sterile saline-- no pain  New dressing and stat lock placed for pt New bag placed     Imaging: IR Radiologist Eval & Mgmt  Result Date: 09/02/2020 Please refer to notes tab for details about interventional procedure. (Op Note)   Labs:  CBC: Recent Labs    08/07/20 1046 08/08/20 0544  WBC 13.5* 13.5*  HGB 11.7* 11.1*  HCT 35.1* 32.8*  PLT 337 299    COAGS: Recent Labs    08/07/20 1046  INR 1.3*    BMP: Recent Labs    08/07/20 1046 08/08/20 0544  NA 132* 133*  K 4.6 4.5  CL 98 99  CO2 25 28  GLUCOSE 207* 129*  BUN 21 22  CALCIUM 9.3 9.2  CREATININE 0.82 0.91  GFRNONAA >60 >60    LIVER FUNCTION TESTS: Recent Labs    08/07/20 1046 08/08/20 0544  BILITOT 16.2* 8.7*  AST 153* 66*  ALT 214* 156*  ALKPHOS 827* 658*  PROT 7.8 6.7  ALBUMIN 2.7* 2.4*    Assessment:  Biliary drain placed 08/07/20 in IR with Dr Elby Showers Dx: Autoimmune pancreatitis  Obstructive jaundice secondary hilar biliary stricture of indeterminate  etiology He is scheduled for MRI/MRCP with Dr Kevan Ny at [facility] 07/12/20 To follow up there next week. Flushes given to pt - if needed; only if drain is clogged He will hear from [facility] IR scheduler for repeat diagnostic cholangiogram, possible tube upsize, possible cholangioplasty. He and wife are  aware and agreeable to this plan  Signed: Robet Leu, PA-C 09/02/2020, 1:44 PM   Please refer to Dr. Deanne Coffer attestation of this note for management and plan.

## 2020-09-03 NOTE — Progress Notes (Addendum)
Addendum for previous IR note:   Pt was given Rx for Prednisone 50 mg #3 To be taken 13 hrs before Rad procedure; 7 hrs before procedure and 1 hour before procedure And Benadryl 50 mg #1 To be taken 1 hour before procedure  Pt will hear from IR scheduler for time and date of diagnostic cholangiogram, possible tube upsize, possible cholangioplasty.  He was instructed on how to take these premedication meds He has good understanding of this plan

## 2020-09-08 ENCOUNTER — Other Ambulatory Visit: Payer: Self-pay | Admitting: Family Medicine

## 2020-09-09 ENCOUNTER — Ambulatory Visit
Admission: RE | Admit: 2020-09-09 | Discharge: 2020-09-09 | Disposition: A | Discharge status: Home / Self Care | Payer: Managed Care, Other (non HMO) | Source: Ambulatory Visit | Attending: Gastroenterology | Admitting: Gastroenterology

## 2020-09-09 ENCOUNTER — Other Ambulatory Visit: Payer: Self-pay

## 2020-09-09 ENCOUNTER — Other Ambulatory Visit: Payer: Self-pay | Admitting: Gastroenterology

## 2020-09-09 DIAGNOSIS — K861 Other chronic pancreatitis: Secondary | ICD-10-CM | POA: Insufficient documentation

## 2020-09-09 DIAGNOSIS — K831 Obstruction of bile duct: Secondary | ICD-10-CM | POA: Diagnosis present

## 2020-09-09 IMAGING — MR MR ABDOMEN WO/W CM MRCP
19 of 22 series · 44 of 48 positions shown · IV contrast (9ml Gadavist)
Comparison: Abdominal MRI [DATE].

CLINICAL DATA: 64-year-old male with history of right upper
quadrant pain and jaundice.

EXAM:
MRI ABDOMEN WITHOUT AND WITH CONTRAST (INCLUDING MRCP)
TECHNIQUE: Multiplanar multisequence MR imaging of the abdomen was performed
both before and after the administration of intravenous contrast.
Heavily T2-weighted images of the biliary and pancreatic ducts were
obtained, and three-dimensional MRCP images were rendered by post
processing.
CONTRAST:  9mL GADAVIST GADOBUTROL 1 MMOL/ML IV SOLN

[Series 3: T2 · coronal · 6.0mm · 1.19mm/px · 1 of 30 slices shown (1 of 2)]
[im 1/30]
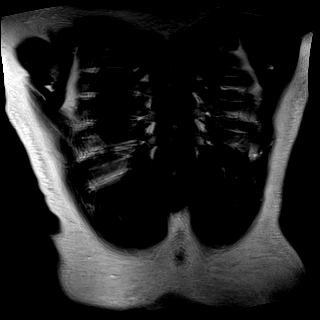

[Series 4: T2 · axial · 6.0mm · 1.19mm/px · 1 of 38 slices shown (2 of 2)]
[im 1/38]
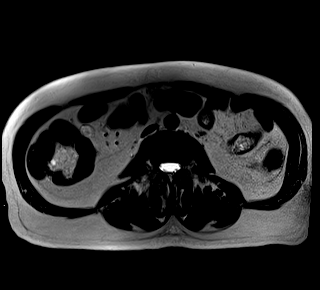

[Series 5: T1 · axial · 6.0mm · 0.74mm/px · z∈[-184,+83]mm · 3 of 76 slices shown]
[im 1/76]
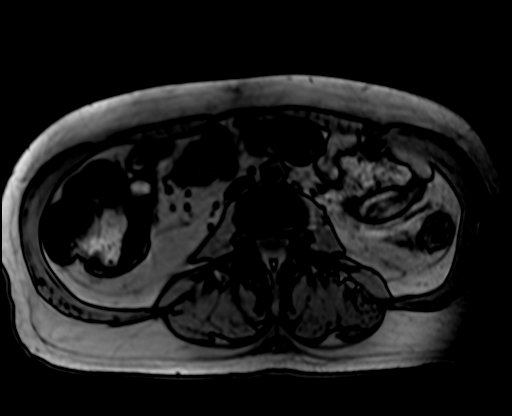
[im 38/76]
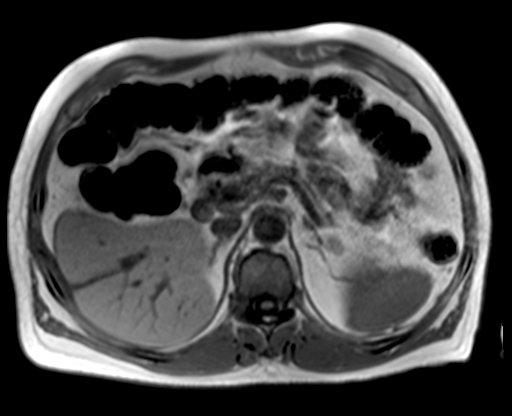
[im 76/76]
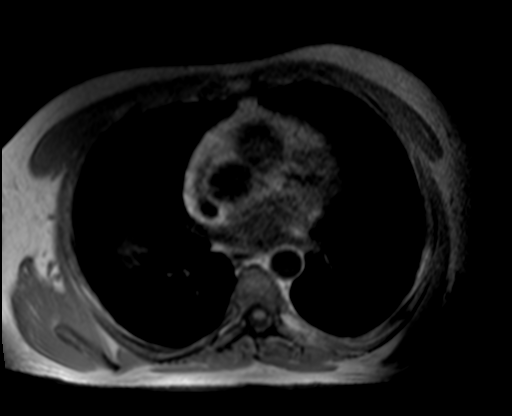

[Series 8: T2 fat-sat · axial · 6.0mm · 1.19mm/px · 1 of 38 slices shown]
[im 1/38]
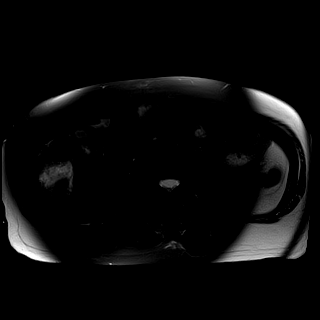

[Series 9: ax dwi_tracew · axial · 6.0mm · 1.42mm/px · z∈[-184,+83]mm · 4 of 114 slices shown]
[im 1/114]
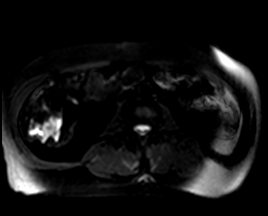
[im 38/114]
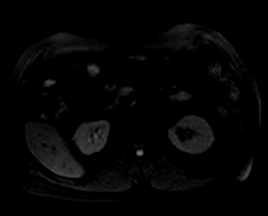
[im 76/114]
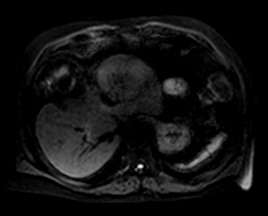
[im 114/114]
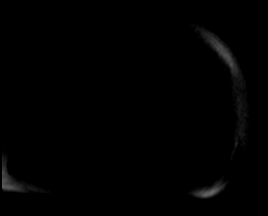

[Series 10: ax dwi_adc · axial · 6.0mm · 1.42mm/px · 1 of 38 slices shown]
[im 1/38]
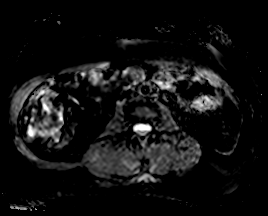

[Series 14: MRCP · coronal · 3.0mm · 1.12mm/px · 1 of 17 slices shown]
[im 1/17]
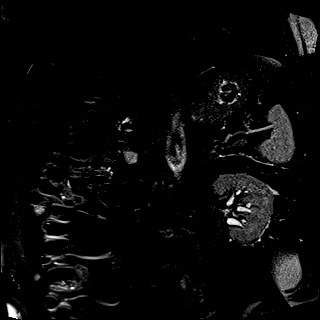

[Series 15: radials · coronal · 50.0mm · 0.78mm/px · 1 of 5 slices shown]
[im 1/5]
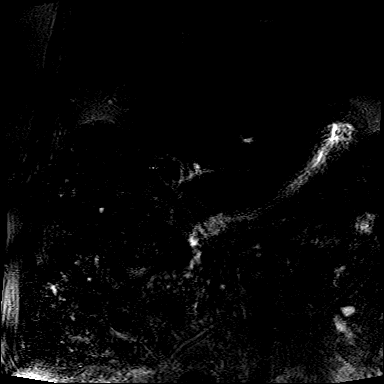

[Series 16: T1 dynamic fat-sat · axial · non-contrast · 3.0mm · 1.19mm/px · z∈[-158,+79]mm · 3 of 80 slices shown (1 of 5)]
[im 1/80]
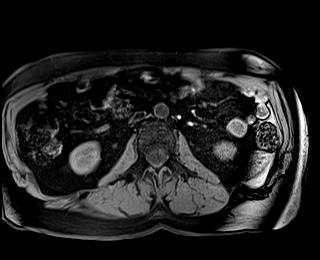
[im 40/80]
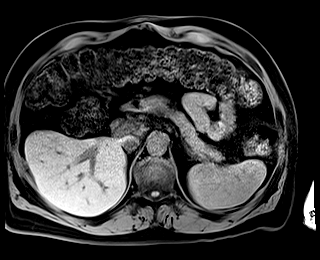
[im 80/80]
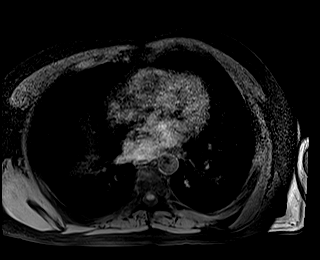

[Series 17: T1 dynamic fat-sat post-contrast · axial · 3.0mm · 1.19mm/px · z∈[-158,+79]mm · 3 of 80 slices shown (1 of 4)]
[im 1/80]
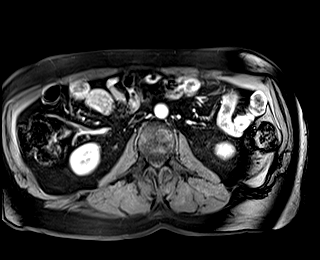
[im 40/80]
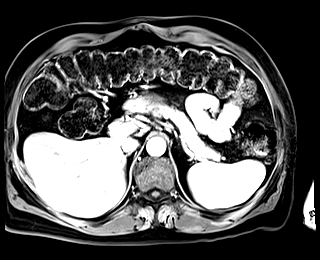
[im 80/80]
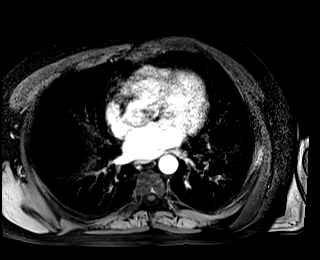

[Series 18: T1 dynamic fat-sat · axial · 3.0mm · 1.19mm/px · z∈[-158,+79]mm · 3 of 80 slices shown (2 of 5)]
[im 1/80]
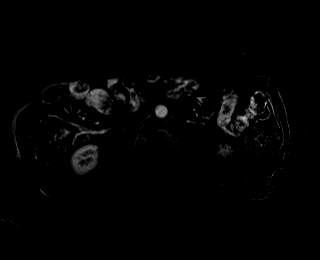
[im 40/80]
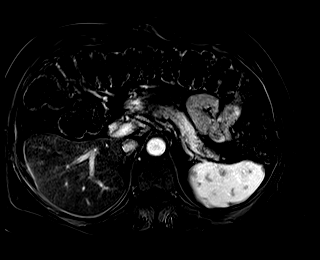
[im 80/80]
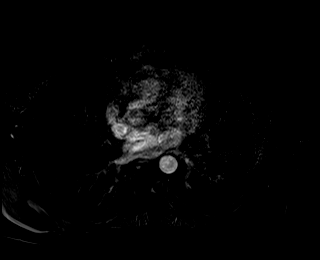

[Series 19: T1 dynamic fat-sat post-contrast · axial · 3.0mm · 1.19mm/px · z∈[-158,+79]mm · 3 of 80 slices shown (2 of 4)]
[im 1/80]
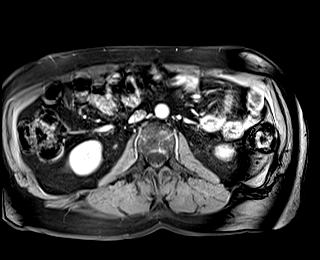
[im 40/80]
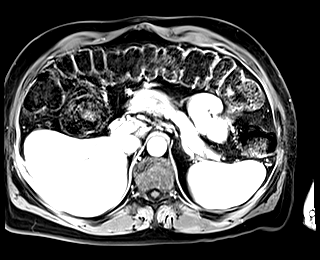
[im 80/80]
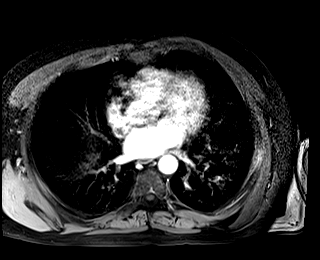

[Series 20: T1 dynamic fat-sat · axial · 3.0mm · 1.19mm/px · z∈[-158,+79]mm · 3 of 80 slices shown (3 of 5)]
[im 1/80]
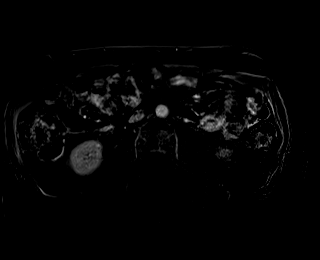
[im 40/80]
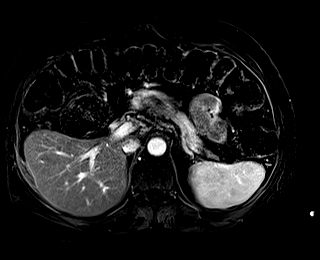
[im 80/80]
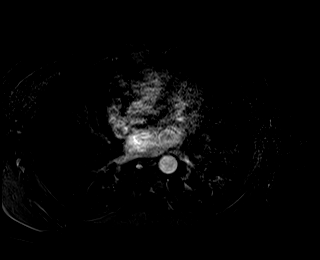

[Series 21: T1 dynamic fat-sat post-contrast · axial · 3.0mm · 1.19mm/px · z∈[-158,+79]mm · 3 of 80 slices shown (3 of 4)]
[im 1/80]
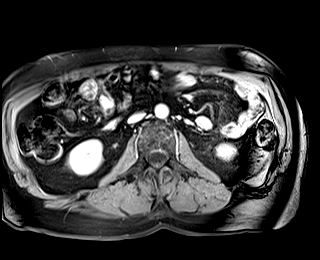
[im 40/80]
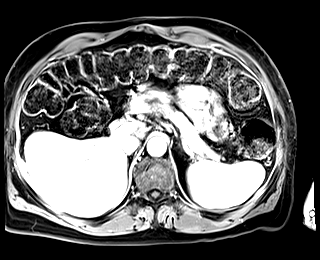
[im 80/80]
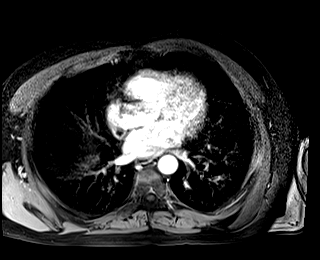

[Series 22: T1 dynamic fat-sat · axial · 3.0mm · 1.19mm/px · z∈[-158,+79]mm · 3 of 80 slices shown (4 of 5)]
[im 1/80]
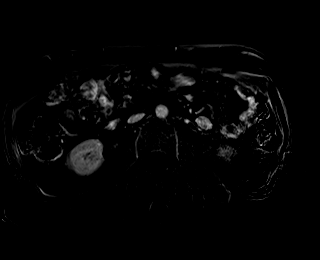
[im 40/80]
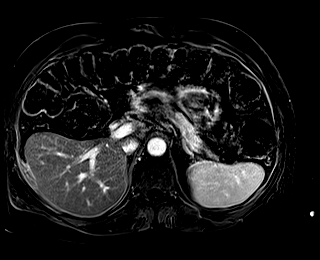
[im 80/80]
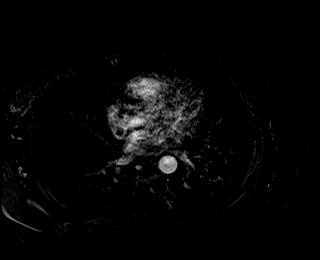

[Series 23: T1 dynamic post-contrast · coronal · 3.0mm · 1.31mm/px · 3 of 72 slices shown]
[im 1/72]
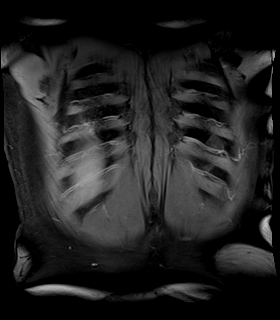
[im 36/72]
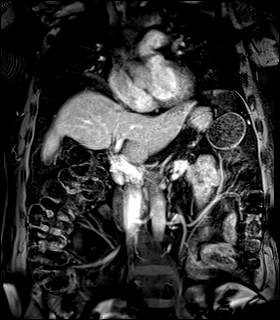
[im 72/72]
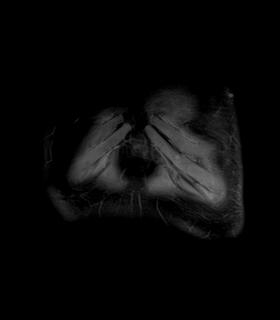

[Series 24: T1 dynamic fat-sat post-contrast · axial · 3.0mm · 1.19mm/px · z∈[-158,+79]mm · 3 of 80 slices shown (4 of 4)]
[im 1/80]
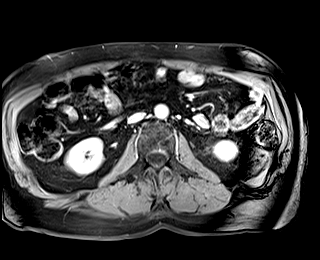
[im 40/80]
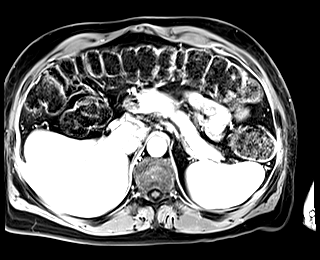
[im 80/80]
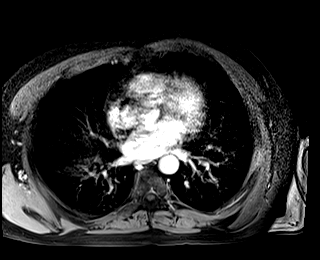

[Series 25: T1 dynamic fat-sat · axial · 3.0mm · 1.19mm/px · z∈[-158,+79]mm · 3 of 80 slices shown (5 of 5)]
[im 1/80]
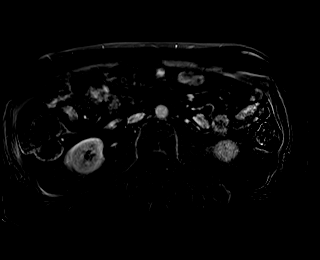
[im 40/80]
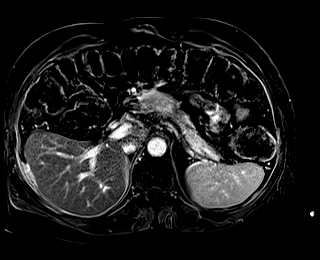
[im 80/80]
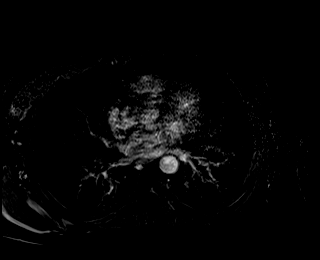

[Series 1058: h-f · axial · 0.6mm · 0.43mm/px · 1 of 7 slices shown]
[im 1/7]
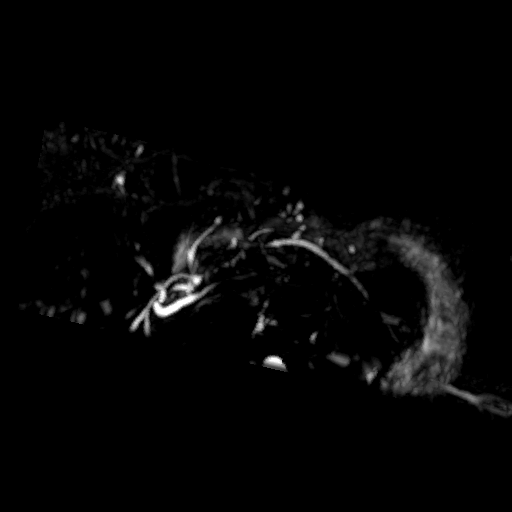

[44 of 48 positions shown; findings below may reference images not displayed]

FINDINGS: Lower chest: Unremarkable.

Hepatobiliary: No suspicious cystic or solid hepatic lesions. MRCP
images demonstrate resolution of previously noted intra and
extrahepatic biliary ductal dilatation. Indwelling percutaneous
biliary drainage catheter not well demonstrated on MR examination.
Due to the small caliber of the intra and extrahepatic biliary
ductal tree on today's MRCP examination and small amount of
respiratory motion, accurate assessment for residual strictures is
not possible on today's study. Status post cholecystectomy.

Pancreas: No pancreatic mass. No pancreatic ductal dilatation. No
pancreatic or peripancreatic fluid collections or inflammatory
changes.

Spleen:  Unremarkable.

Adrenals/Urinary Tract: In the anterior aspect of the interpolar
region of the left kidney there is a 9 mm T1 hypointense, T2
hyperintense, nonenhancing lesion, compatible with a small simple
cyst. Right kidney and bilateral adrenal glands are normal in
appearance. No hydroureteronephrosis in the visualized portions of
the abdomen.

Stomach/Bowel: Visualized portions are unremarkable.

Vascular/Lymphatic: No aneurysm identified in the visualized
abdominal vasculature. No lymphadenopathy noted in the abdomen.

Other: No significant volume of ascites noted in the visualized
portions of the peritoneal cavity.

Musculoskeletal: No aggressive appearing osseous lesions are noted
in the visualized portions of the skeleton.
IMPRESSION: 1. Compared to the prior examination, previously noted intra and
extrahepatic biliary ductal dilatation has resolved following
placement of indwelling percutaneous biliary drainage catheter. Due
to the small caliber of the intrahepatic biliary tree and small
amount of respiratory motion, accurate assessment for residual
biliary strictures is not possible on today's examination.
2. Additional incidental findings, as above.

## 2020-09-09 MED ORDER — GADOBUTROL 1 MMOL/ML IV SOLN
9.0000 mL | Freq: Once | INTRAVENOUS | Status: AC | PRN
  Administered 2020-09-09: 9 mL via INTRAVENOUS

## 2020-09-10 ENCOUNTER — Encounter: Payer: Self-pay | Admitting: Gastroenterology

## 2020-09-14 ENCOUNTER — Encounter: Payer: Self-pay | Admitting: Gastroenterology

## 2020-09-14 ENCOUNTER — Other Ambulatory Visit: Payer: Self-pay | Admitting: Radiology

## 2020-09-15 NOTE — Progress Notes (Signed)
Patient returned call from voice message left yesterday by Wende Bushy, RN.  Discussed with patient to stop taking aspirin today (last dose 09/15/2020) and patient states he no longer takes Aspirin 325 mg and now takes Aspirin 81 mg and will stop today.  He states he is not diabetic.  Discussed NPO after midnight Thursday and he states he understands and will only take sips of water with Friday morning medication.  Patient states his wife, Darcella Cheshire, will accompany him to procedure.  He asked about driving home himself and we discussed need for responsible driver if he receives moderate sedation for procedure and he verbalized understanding.  Reviewed date/time of procedure to arrive Friday 09/18/2020 at 0730 for preparation and procedure time planned for 0830 and patient verbalized understanding of patient instructions. According to the progress note, patient is taking premedication secondary to allergies to shellfish.  At end of conversation, patient had question regarding "pre-approval for procedure".  He asked if I would get his pre-approval and I let him know that I do not obtain pre-approval and he could call Customer Service Billing at [phone].

## 2020-09-16 ENCOUNTER — Encounter: Payer: Self-pay | Admitting: Gastroenterology

## 2020-09-16 ENCOUNTER — Other Ambulatory Visit: Payer: Self-pay | Admitting: Radiology

## 2020-09-16 ENCOUNTER — Other Ambulatory Visit: Admission: RE | Admit: 2020-09-16 | Payer: Managed Care, Other (non HMO) | Source: Ambulatory Visit

## 2020-09-18 ENCOUNTER — Other Ambulatory Visit (HOSPITAL_COMMUNITY): Payer: Self-pay | Admitting: Radiology

## 2020-09-18 ENCOUNTER — Other Ambulatory Visit: Payer: Self-pay | Admitting: Radiology

## 2020-09-18 ENCOUNTER — Other Ambulatory Visit: Payer: Self-pay

## 2020-09-18 ENCOUNTER — Ambulatory Visit
Admission: RE | Admit: 2020-09-18 | Discharge: 2020-09-18 | Disposition: A | Discharge status: Home / Self Care | Payer: Managed Care, Other (non HMO) | Source: Ambulatory Visit | Attending: Radiology | Admitting: Radiology

## 2020-09-18 DIAGNOSIS — K831 Obstruction of bile duct: Secondary | ICD-10-CM | POA: Diagnosis not present

## 2020-09-18 DIAGNOSIS — E785 Hyperlipidemia, unspecified: Secondary | ICD-10-CM | POA: Insufficient documentation

## 2020-09-18 DIAGNOSIS — Z560 Unemployment, unspecified: Secondary | ICD-10-CM | POA: Diagnosis not present

## 2020-09-18 DIAGNOSIS — Z79899 Other long term (current) drug therapy: Secondary | ICD-10-CM | POA: Insufficient documentation

## 2020-09-18 DIAGNOSIS — Z7982 Long term (current) use of aspirin: Secondary | ICD-10-CM | POA: Insufficient documentation

## 2020-09-18 DIAGNOSIS — Z9049 Acquired absence of other specified parts of digestive tract: Secondary | ICD-10-CM | POA: Diagnosis not present

## 2020-09-18 DIAGNOSIS — Z888 Allergy status to other drugs, medicaments and biological substances status: Secondary | ICD-10-CM | POA: Insufficient documentation

## 2020-09-18 DIAGNOSIS — Z4803 Encounter for change or removal of drains: Secondary | ICD-10-CM | POA: Diagnosis not present

## 2020-09-18 DIAGNOSIS — Z8249 Family history of ischemic heart disease and other diseases of the circulatory system: Secondary | ICD-10-CM | POA: Diagnosis not present

## 2020-09-18 DIAGNOSIS — I10 Essential (primary) hypertension: Secondary | ICD-10-CM | POA: Diagnosis not present

## 2020-09-18 DIAGNOSIS — Z881 Allergy status to other antibiotic agents status: Secondary | ICD-10-CM | POA: Diagnosis not present

## 2020-09-18 DIAGNOSIS — Z880 Allergy status to penicillin: Secondary | ICD-10-CM | POA: Insufficient documentation

## 2020-09-18 HISTORY — DX: Other chronic pancreatitis: K86.1

## 2020-09-18 HISTORY — DX: Personal history of urinary calculi: Z87.442

## 2020-09-18 HISTORY — PX: IR CHOLANGIOGRAM EXISTING TUBE: IMG6040

## 2020-09-18 IMAGING — XA IR CHOLANGIOGRAM VIA EXIST CATHETER
1 series · 4 of 4 positions shown · non-contrast
Comparison: none

INDICATION: History of autoimmune pancreatitis and biliary obstruction and
status post percutaneous internal/external biliary drainage catheter
placement on [DATE]. The tube has been and draining quite well
to gravity bag drainage with significant decrease in bilirubin. Tube
exchange now planned with a trial of capping for internal drainage.

[Series 1: fluoroscopy - stored · 4 of 61 frames shown]
[frame 1/61]
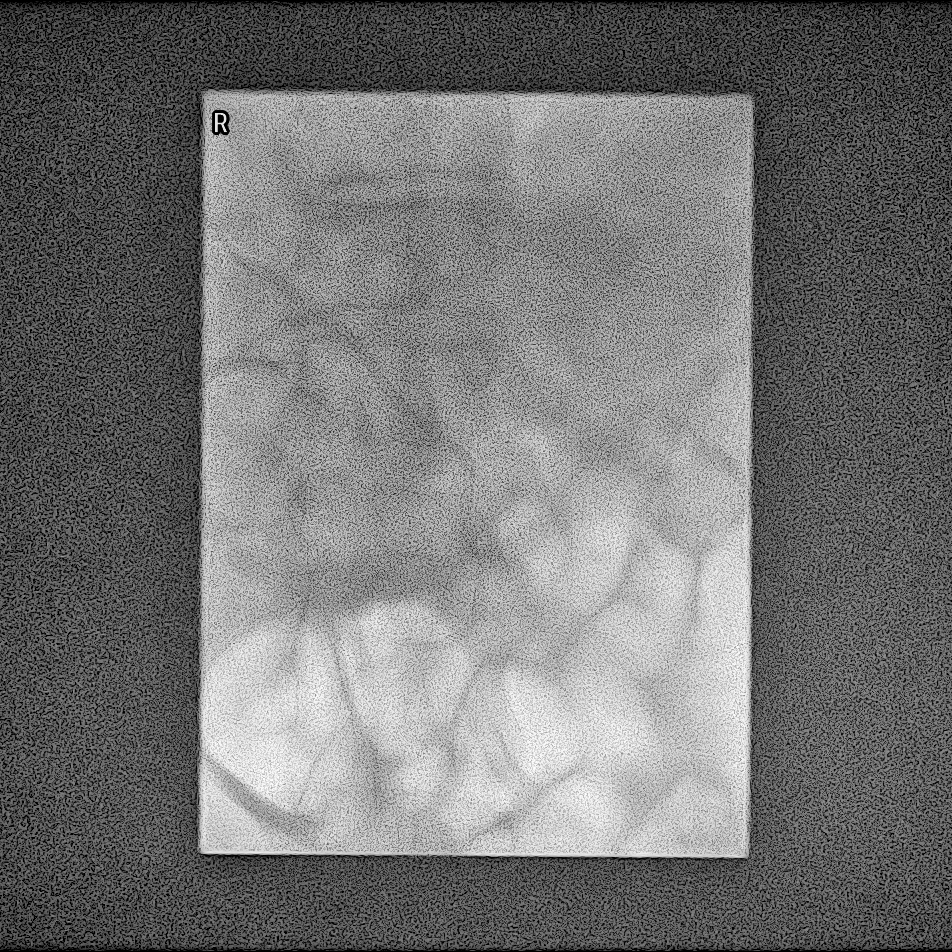
[frame 10/61]
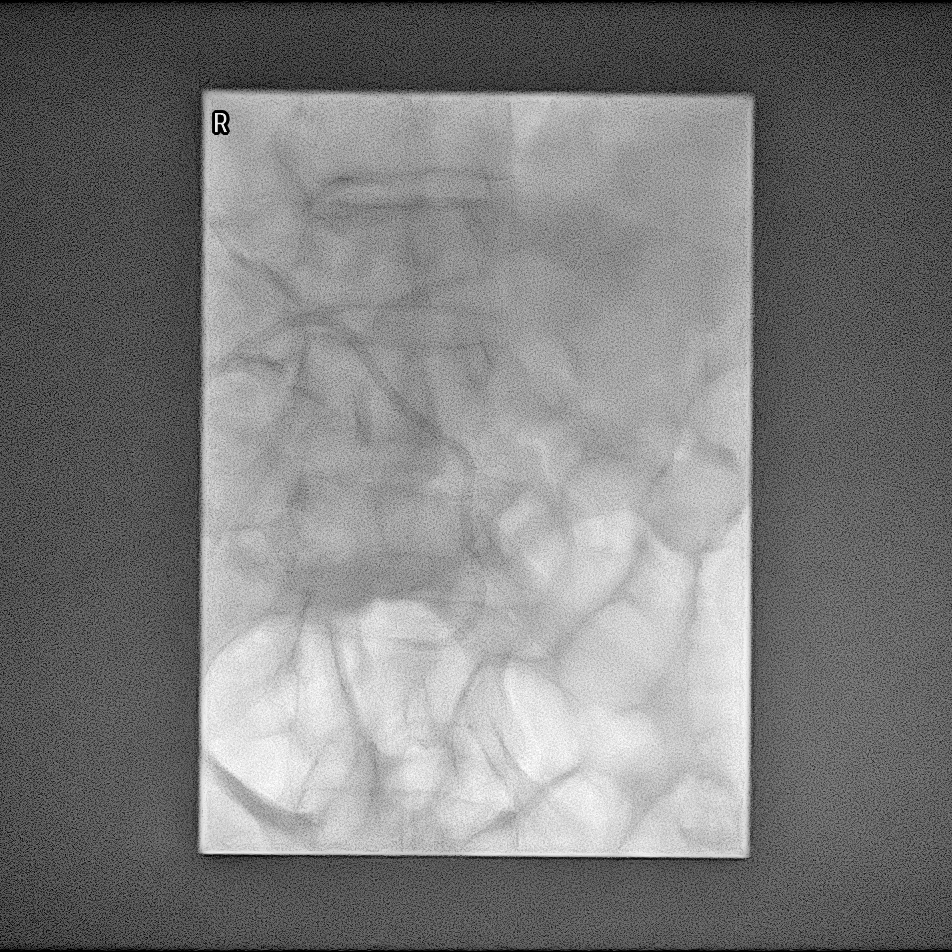
[frame 31/61]
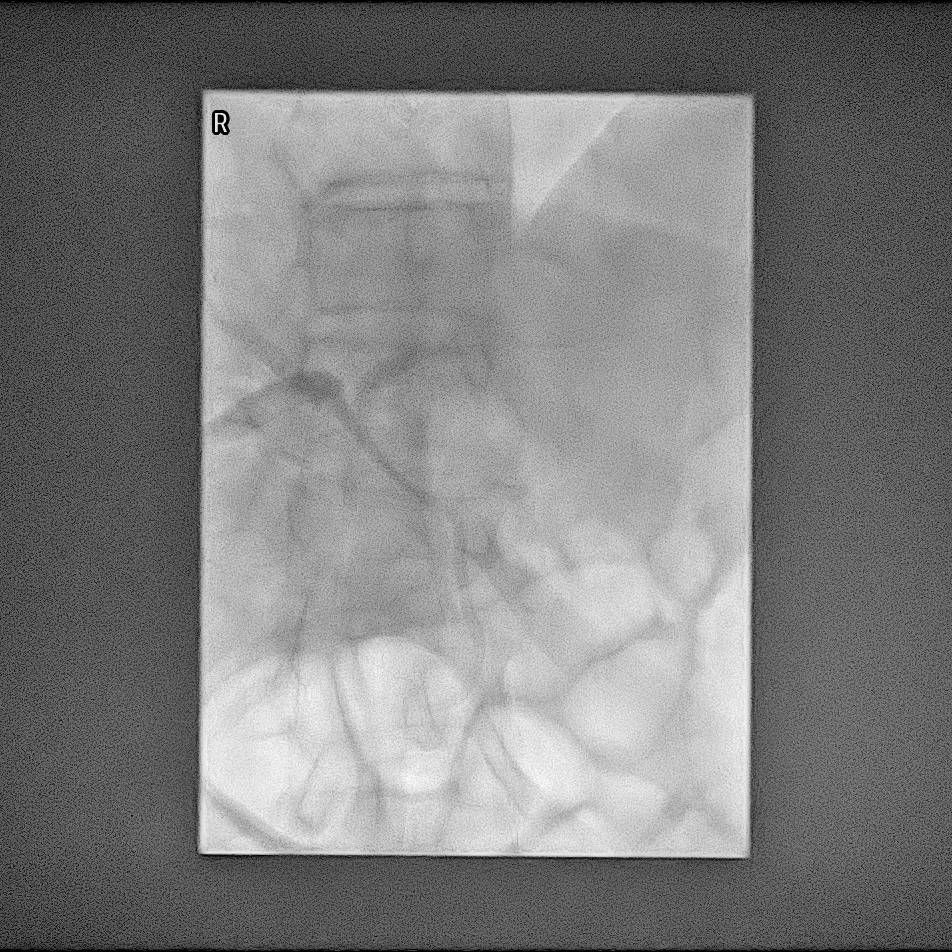
[frame 52/61]
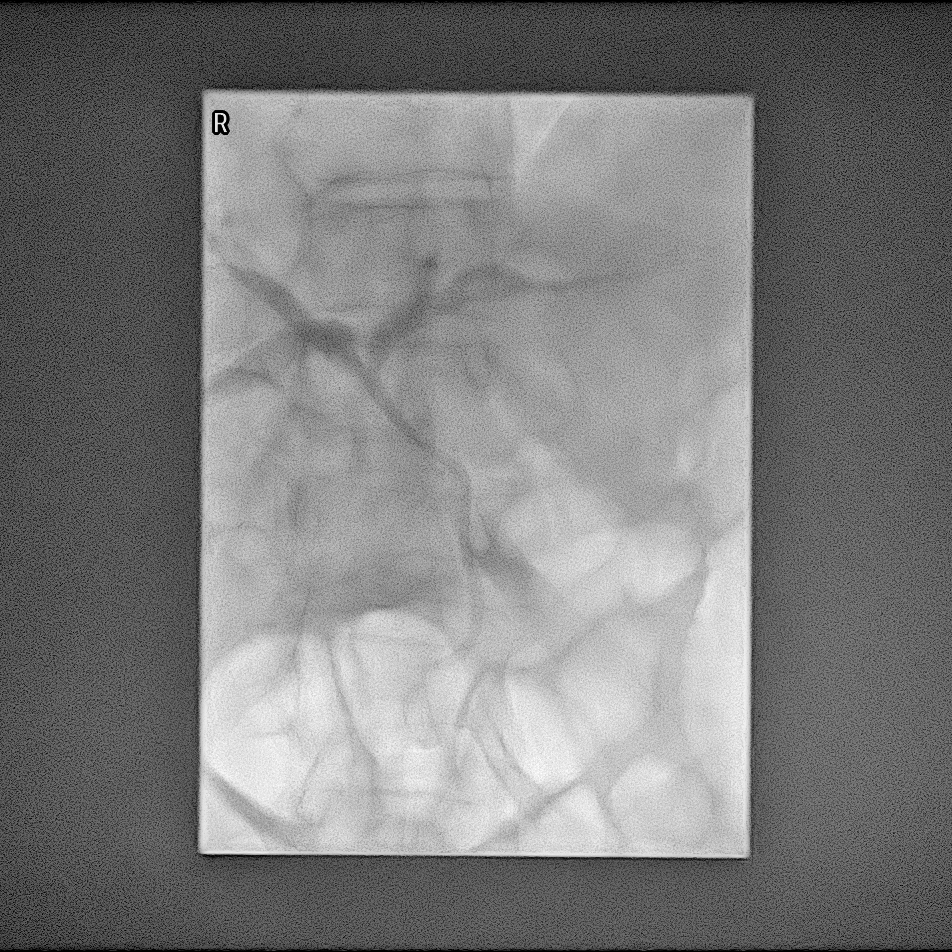

[4 of 4 positions shown; findings below may reference images not displayed]

EXAM:
1. CHOLANGIOGRAM THROUGH PRE-EXISTING BILIARY DRAINAGE CATHETER
2. EXCHANGE OF PERCUTANEOUS INTERNAL/EXTERNAL BILIARY DRAINAGE
CATHETER UNDER FLUOROSCOPY

MEDICATIONS:
None

ANESTHESIA/SEDATION:
None

FLUOROSCOPY TIME:  Fluoroscopy Time: 1 minutes and 24 seconds.
mGy.

CONTRAST:  10 mL Visipaque 320

COMPLICATIONS:
None immediate.

PROCEDURE:
Informed written consent was obtained from the patient after a
thorough discussion of the procedural risks, benefits and
alternatives. All questions were addressed. Maximal Sterile Barrier
Technique was utilized including caps, mask, sterile gowns, sterile
gloves, sterile drape, hand hygiene and skin antiseptic. A timeout
was performed prior to the initiation of the procedure.

The pre-existing internal/external biliary drainage catheter was
injected with contrast material, cut and removed over a guidewire. A
new 8.5 French internal/external biliary drain was advanced over the
guidewire and formed. After contrast injection, a fluoroscopic image
was saved. The tube was capped and secured at the skin with a
Prolene retention suture.
FINDINGS: Cholangiogram shows some persistent mild intrahepatic biliary
dilatation and relative stricture at the level of the left and right
intrahepatic biliary confluence extending into the common hepatic
bile duct. A new biliary drainage catheter was advanced to the
duodenum. This tube will be capped for a trial of internal drainage
given that the bilirubin has now come down to a near normal level
after predominantly external gravity bag drainage. The patient was
given a gravity bag to hook the drain up to should he experience
abdominal pain or fever after tube capping.
IMPRESSION: Exchange of internal/external biliary drainage catheter under
fluoroscopy. The tube was capped for a trial of internal drainage.

## 2020-09-18 MED ORDER — CONTRAST ALLERGY PREMED PACK 3 X 50 MG & 1 X 50 MG PO KIT: 50.0000 mg | PACK | ORAL | 5 refills | Status: DC

## 2020-09-18 MED ORDER — IODIXANOL 320 MG/ML IV SOLN
50.0000 mL | Freq: Once | INTRAVENOUS | Status: AC | PRN
  Administered 2020-09-18: 10 mL

## 2020-09-18 MED ORDER — SODIUM CHLORIDE 0.9 % IV SOLN: INTRAVENOUS | Status: DC

## 2020-09-18 NOTE — H&P (Signed)
Chief Complaint: Biliary stricture. Patient presents for cholangiogram, biliary drain exchange and possible capping  Referring Physician(s):Dr. Allegra Lai  Supervising Physician: Irish Lack  Patient Status: [facility] - Out-pt  History of Present Illness: Dennis Lambert is a 65 y.o. male History HTN. 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  ERCP perform on 2.22.22 was unsuccessful dilating the ampula. IR placed a right sided biliary drain on 2.25.22 as outpatient. Due to concern for possible sepsis post procedure the patient was admitted. Prior cholangiogram scheduled for 2.23.22 was not performed due to lack of premedication. Patient presents for cholangiogram, cholecystomy tube exchange and possible capping. Patient has been premedicated for procedure.   After discussion with IR Attending Dr. Sarita Haver and review of the patient's current lab work and output. Patient will have a cholangiogram, exchange and possible capping. He will be given a gravity bag to go home with and instructed to flush the drain daily. Patient instructed on care of drain if capped and verbalizes understanding. Currently without any significant complaints. Patient alert and laying in bed, calm and comfortable. He states that he has changed his diet to assist in healing. Return precautions and treatment recommendations and follow-up discussed with the patient who is agreeable with the plan.    Past Medical History:  Diagnosis Date  . Autoimmune pancreatitis (HCC)   . Dysthymic disorder    and mood swings controlled on zoloft  . GERD (gastroesophageal reflux disease)   . History of kidney stones   . Hyperlipidemia   . Hypertension   . Renal stone     Past Surgical History:  Procedure Laterality Date  . CHOLECYSTECTOMY  04/28/01   Daphine Deutscher)  . ERCP N/A 08/04/2020   Procedure: ENDOSCOPIC RETROGRADE CHOLANGIOPANCREATOGRAPHY (ERCP);  Surgeon:  [name], MD;  Location: [facility] ENDOSCOPY;  Service: Endoscopy;  Laterality: N/A;  . Hepatobiliary scan  04/17/01   +  . IR PERC CHOLECYSTOSTOMY  08/07/2020  . IR RADIOLOGIST EVAL & MGMT  09/02/2020  . UPPER GASTROINTESTINAL ENDOSCOPY  ~94   Reflux    Allergies: Amoxicillin, Clarithromycin, Codeine sulfate, Flomax [tamsulosin], Iodine, and Shellfish allergy  Medications: Prior to Admission medications   Medication Sig Start Date End Date Taking? Authorizing Provider  aspirin EC 81 MG tablet Take 81 mg by mouth daily. Swallow whole.   Yes [provider]  diphenhydrAMINE (BENADRYL) 50 MG capsule Take 50 mg by mouth every 6 (six) hours as needed.   Yes [provider]  omeprazole (PRILOSEC) 20 MG capsule Take 20 mg by mouth daily.   Yes [provider]  predniSONE (DELTASONE) 10 MG tablet Take 3 tablets (30 mg total) by mouth daily with breakfast for 14 days, THEN 2 tablets (20 mg total) daily with breakfast for 28 days. 08/27/20 10/08/20 Yes Vanga, Loel Dubonnet, MD  sertraline (ZOLOFT) 100 MG tablet TAKE 1 TABLET BY MOUTH  DAILY Patient taking differently: Take 100 mg by mouth daily. 06/19/20  Yes Joaquim Nam, MD  acetaminophen (TYLENOL) 325 MG tablet Take 1 tablet (325 mg total) by mouth every 6 (six) hours as needed for mild pain, fever or headache (or Fever >/= 101). Patient not taking: Reported on 09/18/2020 08/08/20   Lonia Blood, MD  aspirin 325 MG tablet Take 1 tablet (325 mg total) by mouth daily. Patient not taking: Reported on 09/18/2020 08/12/20   Lonia Blood, MD  b complex vitamins tablet Take 1 tablet by mouth daily. Patient  not taking: Reported on 09/18/2020    [provider]  lansoprazole (PREVACID) 30 MG capsule Take 30 mg by mouth daily. Patient not taking: Reported on 09/18/2020 07/10/20 07/10/21  [provider]  Sodium Chloride Flush (NORMAL SALINE FLUSH) 0.9 % SOLN FLUSH WITH SYRINGE AS NEEDED 09/02/20 09/02/21  Oley BalmHassell,  Daniel, MD     Family History  Problem Relation Age of Onset  . Hypertension Mother   . Heart disease Mother        CHF  . Kidney disease Mother        Renal disease  . Cancer Mother        lung cancer  . Fibromyalgia Sister   . COPD Father   . Heart disease Other   . Hypertension Other   . Stroke Other   . Depression Neg Hx   . Alcohol abuse Neg Hx   . Drug abuse Neg Hx   . Colon cancer Neg Hx   . Prostate cancer Neg Hx     Social History   Socioeconomic History  . Marital status: Married    Spouse name: Not on file  . Number of children: 1  . Years of education: Not on file  . Highest education level: Not on file  Occupational History  . 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 Occupation: [employer] - started in 2009  Tobacco Use  . Smoking status: Never Smoker  . Smokeless tobacco: Never Used  Vaping Use  . Vaping Use: Never used  Substance and Sexual Activity  . Alcohol use: No  . Drug use: No  . Sexual activity: Not on file  Other Topics Concern  . Not on file  Social History Narrative   [place]   Married 1983   1 son   Prev with [employer]   Retired as of 2018   Social Determinants of Corporate investment bankerHealth   Financial Resource Strain: Not on [employer]  Food Insecurity: Not on file  Transportation Needs: Not on file  Physical Activity: Not on file  Stress: Not on file  Social Connections: Not on file      Review of Systems: A 12 point ROS discussed and pertinent positives are indicated in the HPI above.  All other systems are negative.  Review of Systems  Constitutional: Negative for fever.  HENT: Negative for congestion.   Respiratory: Negative for cough and shortness of breath.   Cardiovascular: Negative for chest pain.  Gastrointestinal: Negative for abdominal pain.  Neurological: Negative for headaches.  Psychiatric/Behavioral: Negative for behavioral problems and confusion.     Vital Signs: BP 134/80   Pulse 69   Temp 97.8 F (36.6 C)   Ht 5\' 10"  (1.778 m)   Wt 195 lb (88.5 kg)   SpO2 100%   BMI 27.98 kg/m   Physical Exam Vitals and nursing note reviewed.  Constitutional:      Appearance: He is well-developed.  HENT:     Head: Normocephalic.  Cardiovascular:     Rate and Rhythm: Normal rate and regular rhythm.     Heart sounds: Normal heart sounds.  Pulmonary:     Effort: Pulmonary effort is normal.  Musculoskeletal:        General: Normal range of motion.     Cervical back: Normal range of motion.  Skin:    General: Skin is dry.  Neurological:     Mental Status: He is alert  and oriented to person, place, and time.     Imaging: MR 3D Recon At Scanner  Result Date: 09/09/2020 CLINICAL DATA:  65 year old male with history of right upper quadrant pain and jaundice. EXAM: MRI ABDOMEN WITHOUT AND WITH CONTRAST (INCLUDING MRCP) TECHNIQUE: Multiplanar multisequence MR imaging of the abdomen was performed both before and after the administration of intravenous contrast. Heavily T2-weighted images of the biliary and pancreatic ducts were obtained, and three-dimensional MRCP images were rendered by post processing. CONTRAST:  43mL GADAVIST GADOBUTROL 1 MMOL/ML IV SOLN COMPARISON:  Abdominal MRI 07/22/2020. FINDINGS: Lower chest: Unremarkable. Hepatobiliary: No suspicious cystic or solid hepatic lesions. MRCP images demonstrate resolution of previously noted intra and extrahepatic biliary ductal dilatation. Indwelling percutaneous biliary drainage catheter not well demonstrated on MR examination. Due to the small caliber of the intra and extrahepatic biliary ductal tree on today's MRCP examination and small amount of respiratory motion, accurate assessment for residual strictures is not possible on today's study. Status post cholecystectomy. Pancreas: No pancreatic mass. No pancreatic ductal dilatation. No pancreatic or peripancreatic fluid collections or  inflammatory changes. Spleen:  Unremarkable. Adrenals/Urinary Tract: In the anterior aspect of the interpolar region of the left kidney there is a 9 mm T1 hypointense, T2 hyperintense, nonenhancing lesion, compatible with a small simple cyst. Right kidney and bilateral adrenal glands are normal in appearance. No hydroureteronephrosis in the visualized portions of the abdomen. Stomach/Bowel: Visualized portions are unremarkable. Vascular/Lymphatic: No aneurysm identified in the visualized abdominal vasculature. No lymphadenopathy noted in the abdomen. Other: No significant volume of ascites noted in the visualized portions of the peritoneal cavity. Musculoskeletal: No aggressive appearing osseous lesions are noted in the visualized portions of the skeleton. IMPRESSION: 1. Compared to the prior examination, previously noted intra and extrahepatic biliary ductal dilatation has resolved following placement of indwelling percutaneous biliary drainage catheter. Due to the small caliber of the intrahepatic biliary tree and small amount of respiratory motion, accurate assessment for residual biliary strictures is not possible on today's examination. 2. Additional incidental findings, as above. Electronically Signed   By: Trudie Reed M.D.   On: 09/09/2020 20:51   MR ABDOMEN MRCP W WO CONTAST  Result Date: 09/09/2020 CLINICAL DATA:  65 year old male with history of right upper quadrant pain and jaundice. EXAM: MRI ABDOMEN WITHOUT AND WITH CONTRAST (INCLUDING MRCP) TECHNIQUE: Multiplanar multisequence MR imaging of the abdomen was performed both before and after the administration of intravenous contrast. Heavily T2-weighted images of the biliary and pancreatic ducts were obtained, and three-dimensional MRCP images were rendered by post processing. CONTRAST:  68mL GADAVIST GADOBUTROL 1 MMOL/ML IV SOLN COMPARISON:  Abdominal MRI 07/22/2020. FINDINGS: Lower chest: Unremarkable. Hepatobiliary: No suspicious cystic or  solid hepatic lesions. MRCP images demonstrate resolution of previously noted intra and extrahepatic biliary ductal dilatation. Indwelling percutaneous biliary drainage catheter not well demonstrated on MR examination. Due to the small caliber of the intra and extrahepatic biliary ductal tree on today's MRCP examination and small amount of respiratory motion, accurate assessment for residual strictures is not possible on today's study. Status post cholecystectomy. Pancreas: No pancreatic mass. No pancreatic ductal dilatation. No pancreatic or peripancreatic fluid collections or inflammatory changes. Spleen:  Unremarkable. Adrenals/Urinary Tract: In the anterior aspect of the interpolar region of the left kidney there is a 9 mm T1 hypointense, T2 hyperintense, nonenhancing lesion, compatible with a small simple cyst. Right kidney and bilateral adrenal glands are normal in appearance. No hydroureteronephrosis in the visualized portions of the abdomen. Stomach/Bowel: Visualized portions are  unremarkable. Vascular/Lymphatic: No aneurysm identified in the visualized abdominal vasculature. No lymphadenopathy noted in the abdomen. Other: No significant volume of ascites noted in the visualized portions of the peritoneal cavity. Musculoskeletal: No aggressive appearing osseous lesions are noted in the visualized portions of the skeleton. IMPRESSION: 1. Compared to the prior examination, previously noted intra and extrahepatic biliary ductal dilatation has resolved following placement of indwelling percutaneous biliary drainage catheter. Due to the small caliber of the intrahepatic biliary tree and small amount of respiratory motion, accurate assessment for residual biliary strictures is not possible on today's examination. 2. Additional incidental findings, as above. Electronically Signed   By: Trudie Reed M.D.   On: 09/09/2020 20:51   IR Radiologist Eval & Mgmt  Result Date: 09/02/2020 Please refer to notes tab  for details about interventional procedure. (Op Note)   Labs:  CBC: Recent Labs    08/07/20 1046 08/08/20 0544  WBC 13.5* 13.5*  HGB 11.7* 11.1*  HCT 35.1* 32.8*  PLT 337 299    COAGS: Recent Labs    08/07/20 1046  INR 1.3*    BMP: Recent Labs    08/07/20 1046 08/08/20 0544  NA 132* 133*  K 4.6 4.5  CL 98 99  CO2 25 28  GLUCOSE 207* 129*  BUN 21 22  CALCIUM 9.3 9.2  CREATININE 0.82 0.91  GFRNONAA >60 >60    LIVER FUNCTION TESTS: Recent Labs    08/07/20 1046 08/08/20 0544  BILITOT 16.2* 8.7*  AST 153* 66*  ALT 214* 156*  ALKPHOS 827* 658*  PROT 7.8 6.7  ALBUMIN 2.7* 2.4*      Assessment and Plan:  65 y.o. male outpatient. History HTN. 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  ERCP perform on 2.22.22 was unsuccessful dilating the ampula. IR placed a right sided biliary drain on 2.25.22 as outpatient. Due to concern for possible sepsis post procedure the patient was admitted. Prior cholangiogram scheduled for 2.23.22 was not performed due to lack of premedication. Patient presents for cholangiogram, cholecystomy tube exchange and possible capping. Patient has been premedicated for procedure.  Per care everywhere Total bilirubin from 4.6.22 is 1.7, alkaline phosphatase 120, sodium 139. Recent output has been 1250 ml, 1220 ml, 1200 ml, and 91478 ml.  All medications are within acceptable parameters. Allergies include iodine reaction unknown and  prior shellfish allergy. Patient states has been able to eat shellfish since 2017. Other pertinent allergies include Clarithromycin, PCN, codeine. Patient has been NPO since midnight for possible sedation.   After discussion with IR Attending Dr. Sarita Haver and review of the patient's current lab work and output. Patient will have a cholangiogram, exchange and possible capping. He will be given a gravity bag to go home with and instructed to flush the  drain daily. Patient instructed on care of drain if capped and verbalizes understanding. He Risks and benefits discussed with the patient including bleeding, infection, damage to adjacent structures, bowel perforation/fistula connection, and sepsis.  All of the patient's questions were answered, patient is agreeable to proceed. Consent signed and in chart.   Thank you for this interesting consult.  I greatly enjoyed meeting Dennis Lambert and look forward to participating in their care.  A copy of this report was sent to the requesting provider on this date.  Electronically Signed: Alene Mires, NP 09/18/2020, 8:39 AM   I spent a total of  30 Minutes   in face to face in  clinical consultation, greater than 50% of which was counseling/coordinating care for cholangiogram, biliary drain exchange possible capping

## 2020-09-18 NOTE — Discharge Instructions (Signed)
Cholecystostomy, Care After This sheet gives you information about how to care for yourself after your procedure. Your health care provider may also give you more specific instructions. If you have problems or questions, contact your health care provider. What can I expect after the procedure? After your procedure, it is common to have soreness near the incision site of your drainage tube (catheter). Follow these instructions at home: Incision care  Follow instructions from your health care provider about how to take care of your incision site where the catheter was inserted. Make sure you: ? Wash your hands with soap and water before and after you change your bandage (dressing). If soap and water are not available, use hand sanitizer. ? Change your dressing as told by your health care provider.  Check the incision site every day for signs of infection. Check for: ? Redness, swelling, or pain. ? Fluid or blood. ? Warmth. ? Pus or a bad smell.  Do not take baths, swim, or use a hot tub until your health care provider approves. Ask your health care provider if you may take showers. You may only be allowed to take sponge baths.   General instructions  Follow instructions from your health care provider about how to care for your catheter and collection bag at home.  Your health care provider will show you: ? How to record the amount of drainage from the catheter. ? How to flush the catheter. ? How to care for the catheter incision site.  Follow instructions from your health care provider about eating or drinking restrictions.  Take over-the-counter and prescription medicines only as told by your health care provider.  Keep all follow-up visits as told by your health care provider. This is important. Contact a health care provider if:  You have redness, swelling, or pain around the catheter incision site.  You have nausea or vomiting. Get help right away if:  Your abdominal pain  gets worse.  You feel dizzy or you faint while standing.  You have fluid or blood coming from the catheter incision site.  The area around the catheter incision site feels warm to the touch.  You have pus or a bad smell coming from the catheter incision site.  You have a fever.  You have shortness of breath.  You have a rapid heartbeat.  Your nausea or vomiting does not go away.  Your catheter becomes blocked.  Your catheter comes out of your abdomen. Summary  After your procedure, it is common to have soreness near the incision site of your drainage tube (catheter).  Wash your hands with soap and water before and after you change your bandage (dressing). Change your dressing as told by your health care provider.  Check the catheter incision site every day for signs of infection. Check for redness, swelling, pain, fluid, blood, warmth, pus, or a bad smell.  Contact your health care provider if you have nausea or vomiting, or if you have redness, swelling, or pain around your catheter incision site.  Get help right away if your abdominal pain gets worse, you feel dizzy, you have blood or fluid coming from the catheter incision site, you have a fever, or you have shortness of breath. This information is not intended to replace advice given to you by your health care provider. Make sure you discuss any questions you have with your health care provider. Document Revised: 12/25/2017 Document Reviewed: 12/25/2017 Elsevier Patient Education  2021 [employer]  

## 2020-09-19 ENCOUNTER — Encounter: Payer: Self-pay | Admitting: Gastroenterology

## 2020-09-19 ENCOUNTER — Other Ambulatory Visit: Payer: Self-pay | Admitting: Gastroenterology

## 2020-09-19 DIAGNOSIS — K861 Other chronic pancreatitis: Secondary | ICD-10-CM

## 2020-09-24 ENCOUNTER — Other Ambulatory Visit: Payer: Self-pay | Admitting: Family Medicine

## 2020-10-01 ENCOUNTER — Other Ambulatory Visit: Payer: Self-pay

## 2020-10-01 ENCOUNTER — Encounter: Payer: Self-pay | Admitting: Gastroenterology

## 2020-10-01 ENCOUNTER — Ambulatory Visit (INDEPENDENT_AMBULATORY_CARE_PROVIDER_SITE_OTHER): Payer: Managed Care, Other (non HMO) | Admitting: Gastroenterology

## 2020-10-01 VITALS — BP 143/81 | HR 71 | Temp 98.0°F | Ht 70.0 in | Wt 202.4 lb

## 2020-10-01 DIAGNOSIS — K861 Other chronic pancreatitis: Secondary | ICD-10-CM

## 2020-10-01 DIAGNOSIS — K831 Obstruction of bile duct: Secondary | ICD-10-CM | POA: Diagnosis not present

## 2020-10-01 NOTE — Progress Notes (Addendum)
Arlyss Repress, MD [address]  [address]  [place], [place] [address]  Main: [phone]  Fax: [phone]    Gastroenterology Consultation  Referring Provider:     Marguarite Arbour, MD Primary Care Physician:  Marguarite Arbour, MD Primary Gastroenterologist:  Dr. Arlyss Repress Reason for Consultation: Autoimmune pancreatitis        HPI:   Dennis Lambert is a 65 y.o. male referred by Dr. Judithann Sheen, Duane Lope, MD  for consultation & management of jaundice.  Patient reports that in end of January, he had mild discomfort in his epigastric area, radiating to his back, he felt it was secondary to his constipation.  Patient went for physical on 1/28, labs revealed abnormal LFTs, AST 71, ALT 65, alkaline phosphatase 127, total bilirubin 0.4, CBC was normal.  Repeat labs on 2/1 revealed elevated lipase and worsening of LFTs.  Subsequently, patient underwent CT abdomen and pelvis which revealed diffuse haziness in the peripancreatic fat concerning for acute pancreatitis.  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  There is also an abrupt stricture of the CBD in head of the pancreas.  The pancreas appeared diffusely thickened and heterogeneous parenchyma, heterogeneous enhancement as well as segmental narrowing/stricture of the main pancreatic duct without associated dilated segment, these features suspicious for autoimmune pancreatitis.  There is 18 mm hepatoduodenal ligament lymph node.  There is also nodular masslike configuration of the pancreatic head.  Patient said, over the last 1 year, he has been trying to lose weight, lost about 20 pounds intentionally.  He denies any abdominal pain, nausea or vomiting.  He does have severe yellowing of his skin and eyes.  He does report some decreased appetite, watchful of his diet as well, having small  portion meal only.  He denies fever, chills.  Patient takes aspirin 325 mg, self medicate for primary prevention  Follow-up visit 10/01/2020 Patient underwent ERCP on 08/04/2020 for biliary decompression, however it was unsuccessful due to altered anatomy of the major ampulla.  Therefore, patient underwent IR guided internal and external hepatic percutaneous biliary drain.  He underwent work-up of autoimmune pancreatitis based on imaging findings, found to have significantly elevated IgG4 levels at 230, ANA was negative.  I started him on prednisone 40 mg daily, his LFTs have significantly improved with combination of prednisone therapy and biliary decompression.  His IgG4 levels have significantly decreased from 230 to 107.  Follow-up MRI/MRCP 09/09/2020 to be no evidence of pancreatic lesion, no pancreatic ductal dilation, no evidence of inflammatory changes in the pancreas, complete resolution of intra and extrahepatic biliary ductal dilatation.  Have decreased his prednisone to 20 mg daily which he is currently on.  Patient denies any GI symptoms today.  His energy levels are back to his baseline, has been physically active.  He still has a drain but not connected to a bag, no output from the drain at this time.     Hepatic Function Latest Ref Rng & Units 08/08/2020 08/07/2020 08/08/2019  Total Protein 6.5 - 8.1 g/dL 6.7 7.8 6.5  Albumin 3.5 - 5.0 g/dL 2.4(L) 2.7(L) 4.3  AST 15 - 41 U/L 66(H) 153(H) 24  ALT 0 - 44 U/L 156(H) 214(H) 25  Alk Phosphatase 38 - 126 U/L 658(H) 827(H) 86  Total Bilirubin 0.3 - 1.2 mg/dL 5.9(Q) 16.2(H) 0.4  Bilirubin, Direct 0.0 - 0.2 mg/dL  4.7(H) - -    NSAIDs: Aspirin 325 mg daily until 2/22  Antiplts/Anticoagulants/Anti thrombotics: Aspirin 81 mg daily  GI Procedures: S/p cholecystectomy several years ago Never had a colonoscopy, but have had a couple different types of take home tests every physical every year for years. They always were negative.   Patient  denies family history of pancreatic cancer, colon cancer  Past Medical History:  Diagnosis Date  . Autoimmune pancreatitis (HCC)   . Dysthymic disorder    and mood swings controlled on zoloft  . GERD (gastroesophageal reflux disease)   . History of kidney stones   . Hyperlipidemia   . Hypertension   . Renal stone     Past Surgical History:  Procedure Laterality Date  . CHOLECYSTECTOMY  04/28/01   Daphine Deutscher)  . ERCP N/A 08/04/2020   Procedure: ENDOSCOPIC RETROGRADE CHOLANGIOPANCREATOGRAPHY (ERCP);  Surgeon: [name], MD;  Location: [facility] ENDOSCOPY;  Service: Endoscopy;  Laterality: N/A;  . Hepatobiliary scan  04/17/01   +  . IR CHOLANGIOGRAM EXISTING TUBE  09/18/2020  . IR PERC CHOLECYSTOSTOMY  08/07/2020  . IR RADIOLOGIST EVAL & MGMT  09/02/2020  . UPPER GASTROINTESTINAL ENDOSCOPY  ~94   Reflux    Current Outpatient Medications:  .  aspirin EC 81 MG tablet, Take 81 mg by mouth daily. Swallow whole., Disp: , Rfl:  .  omeprazole (PRILOSEC) 20 MG capsule, TAKE 1 CAPSULE BY MOUTH  DAILY, Disp: 90 capsule, Rfl: 3 .  predniSONE (DELTASONE) 10 MG tablet, Take 2 tablets (20 mg total) by mouth daily with breakfast., Disp: 60 tablet, Rfl: 2 .  sertraline (ZOLOFT) 100 MG tablet, Take 1 tablet (100 mg total) by mouth daily., Disp: 90 tablet, Rfl: 0 .  simvastatin (ZOCOR) 40 MG tablet, TAKE 1 TABLET BY MOUTH AT  BEDTIME, Disp: 90 tablet, Rfl: 3 .  Sodium Chloride Flush (NORMAL SALINE FLUSH) 0.9 % SOLN, FLUSH WITH SYRINGE AS NEEDED, Disp: 100 mL, Rfl: 0   Family History  Problem Relation Age of Onset  . Hypertension Mother   . Heart disease Mother        CHF  . Kidney disease Mother        Renal disease  . Cancer Mother        lung cancer  . Fibromyalgia Sister   . COPD Father   . Heart disease Other   . Hypertension Other   . Stroke Other   . Depression Neg Hx   . Alcohol abuse Neg Hx   . Drug abuse Neg Hx   . Colon cancer Neg Hx   . Prostate cancer Neg Hx      Social  History   Tobacco Use  . Smoking status: Never Smoker  . Smokeless tobacco: Never Used  Vaping Use  . Vaping Use: Never used  Substance Use Topics  . Alcohol use: No  . Drug use: No    Allergies as of 10/01/2020 - Review Complete 10/01/2020  Allergen Reaction Noted  . Amoxicillin    . Clarithromycin    . Codeine sulfate    . Flomax [tamsulosin] Other (See Comments) 11/27/2018  . Iodine Other (See Comments) 07/10/2020  . Shellfish allergy  10/11/2011    Review of Systems:    All systems reviewed and negative except where noted in HPI.   Physical Exam:  BP (!) 143/81 (BP Location: Left Arm, Patient Position: Sitting, Cuff Size: Normal)   Pulse 71   Temp 98 F (36.7 C) (Oral)  Ht '5\' 10"'$  (1.778 m)   Wt 202 lb 6 oz (91.8 kg)   BMI 29.04 kg/m  No LMP for male patient.  General:   Alert,  Well-developed, well-nourished, pleasant and cooperative in NAD Head:  Normocephalic and atraumatic. Eyes: deep icterus.   Conjunctiva pink. Ears:  Normal auditory acuity. Nose:  No deformity, discharge, or lesions. Mouth:  No deformity or lesions,oropharynx pink & moist. Neck:  Supple; no masses or thyromegaly. Lungs:  Respirations even and unlabored.  Clear throughout to auscultation.   No wheezes, crackles, or rhonchi. No acute distress. Heart:  Regular rate and rhythm; no murmurs, clicks, rubs, or gallops. Abdomen:  Normal bowel sounds. Soft, non-tender and non-distended without masses, hepatosplenomegaly or hernias noted.  No guarding or rebound tenderness.   Rectal: Not performed Msk:  Symmetrical without gross deformities. Good, equal movement & strength bilaterally. Pulses:  Normal pulses noted. Extremities:  No clubbing or edema.  No cyanosis. Neurologic:  Alert and oriented x3;  grossly normal neurologically. Skin:  Intact without significant lesions or rashes. No jaundice. Psych:  Alert and cooperative. Normal mood and affect.  Imaging Studies: Reviewed  Assessment and  Plan:   Dennis Lambert is a 65 y.o. pleasant Caucasian male with history of hypertension, hyperlipidemia, BMI 30 is seen in follow-up of autoimmune pancreatitis resulting in biliary stricture causing jaundice, s/p attempted ERCP but unsuccessful due to altered anatomy of the major papilla, s/p image-guided intra and extrahepatic biliary drain.  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  Recommend to recheck IgG4 levels as well as LFTs, if continue to improve, decrease prednisone to 10 mg daily Also, I discussed with patient regarding second opinion for long-term management of autoimmune pancreatitis with pancreatic specialist, patient request to be seen in 2, referral placed Also, have discussed with patient regarding endoscopic ultrasound of the pancreas and he is agreeable, referral placed   Follow up after upper EUS   Cephas Darby, MD

## 2020-10-05 ENCOUNTER — Telehealth: Payer: Self-pay

## 2020-10-05 NOTE — Telephone Encounter (Signed)
-----   Message from Toney Reil, MD sent at 10/05/2020  4:24 PM EDT ----- Regarding: RE: EUS This reasonable approach.  Patient is referred to [facility] for second opinion We can hold off on EUS at this time  Morrie Sheldon  Please inform patient about deferring EUS at this time until seen by [facility]  Thanks RV ----- Message ----- From: Benita Gutter, RN Sent: 10/05/2020   1:01 PM EDT To: Toney Reil, MD, Denman George, CMA Subject: EUS                                            Hi Dr. Allegra Lai, I have reached out to Dr. Chevis Pretty from [facility] regarding scheduling. He had response below:  It looks like all the concerning findings resolved on follow up imaging so not sure there would be much for [place] to see and we typically do not biopsy the pancreas for autoimmune pancreatitis. Do you know if they are being seen here at [facility] for a second opinion? If they are going to see [place] in clinic would hold off on the EUS for now until they are seen and could make a decision after now that no mass is seen. If not be seen here not unreasonable for an EUS to ensure no LN or subtle mass.  Thanks Josh   ----- Message ----- From: Denman George, CMA Sent: 10/01/2020   2:18 PM EDT To: Benita Gutter, RN  Dr. Allegra Lai wants to order a upper EUS for Autoimmune pancreatitis.  Thank you  Morrie Sheldon

## 2020-10-05 NOTE — Telephone Encounter (Signed)
Received request for EUS. Records sent to [facility] for review prior to scheduling. 

## 2020-10-05 NOTE — Telephone Encounter (Signed)
Called and left a message for call back. Sent mychart message  °

## 2020-10-15 ENCOUNTER — Other Ambulatory Visit (HOSPITAL_COMMUNITY): Payer: Self-pay | Admitting: Student

## 2020-10-15 ENCOUNTER — Telehealth: Payer: Self-pay | Admitting: Student

## 2020-10-15 ENCOUNTER — Other Ambulatory Visit: Payer: Self-pay | Admitting: Interventional Radiology

## 2020-10-15 ENCOUNTER — Encounter: Payer: Self-pay | Admitting: Gastroenterology

## 2020-10-15 DIAGNOSIS — K819 Cholecystitis, unspecified: Secondary | ICD-10-CM

## 2020-10-15 NOTE — Telephone Encounter (Signed)
Patient called the clinic requesting more saline flushes for his internal/external biliary drain. He also had a question regarding his next drain exchange/IR follow up. Per Dr. Fredia Sorrow, the patient needs a routine drain exchange in approximately 4 weeks. A clinic scheduler will call the patient with this date/time and the scheduler will also call in a prescription to a pharmacy in [place] that carries saline flushes.   Alwyn Ren, [place] [phone] 10/15/2020, 2:28 PM

## 2020-10-16 ENCOUNTER — Other Ambulatory Visit (HOSPITAL_COMMUNITY): Payer: Self-pay

## 2020-10-16 MED ORDER — NORMAL SALINE FLUSH 0.9 % IV SOLN
INTRAVENOUS | 0 refills | Status: AC
  Filled 2020-10-16: qty 150, 15d supply, fill #0

## 2020-10-19 ENCOUNTER — Encounter: Payer: Self-pay | Admitting: Gastroenterology

## 2020-10-19 MED ORDER — PREDNISONE 10 MG PO TABS: 10.0000 mg | ORAL_TABLET | Freq: Every day | ORAL | 0 refills | Status: AC

## 2020-10-24 ENCOUNTER — Encounter: Payer: Self-pay | Admitting: Gastroenterology

## 2020-10-24 DIAGNOSIS — K861 Other chronic pancreatitis: Secondary | ICD-10-CM

## 2020-10-24 DIAGNOSIS — K831 Obstruction of bile duct: Secondary | ICD-10-CM

## 2020-10-26 NOTE — Telephone Encounter (Signed)
Order labs.

## 2020-11-02 ENCOUNTER — Telehealth: Payer: Self-pay | Admitting: Student

## 2020-11-02 NOTE — Telephone Encounter (Signed)
Pre-medication prescriptions called into the [facility] for patient's contrast allergy. Patient has procedure scheduled 11/13/20 at [facility] for cholangiogram with tube exchange.   50 mg Prednisone PO at hours 13, 7 and 1 prior to procedure. 50 mg Benadryl PO one hour prior to procedure.  A voicemail was left on the patient's cell phone notifying him that these medications had been called in.   Alwyn Ren, AGACNP-BC [phone] 11/02/2020, 9:58 AM

## 2020-11-05 ENCOUNTER — Encounter: Payer: Self-pay | Admitting: Gastroenterology

## 2020-11-05 LAB — COMPREHENSIVE METABOLIC PANEL
ALT: 37 IU/L (ref 0–44)
AST: 23 IU/L (ref 0–40)
Albumin/Globulin Ratio: 2.1 (ref 1.2–2.2)
Albumin: 4 g/dL (ref 3.8–4.8)
Alkaline Phosphatase: 95 IU/L (ref 44–121)
BUN/Creatinine Ratio: 14 (ref 10–24)
BUN: 15 mg/dL (ref 8–27)
Bilirubin Total: 0.4 mg/dL (ref 0.0–1.2)
CO2: 28 mmol/L (ref 20–29)
Calcium: 9.6 mg/dL (ref 8.6–10.2)
Chloride: 100 mmol/L (ref 96–106)
Creatinine, Ser: 1.09 mg/dL (ref 0.76–1.27)
Globulin, Total: 1.9 g/dL (ref 1.5–4.5)
Glucose: 85 mg/dL (ref 65–99)
Potassium: 4.2 mmol/L (ref 3.5–5.2)
Sodium: 140 mmol/L (ref 134–144)
Total Protein: 5.9 g/dL — ABNORMAL LOW (ref 6.0–8.5)
eGFR: 75 mL/min/{1.73_m2} (ref >59)

## 2020-11-05 LAB — IGG 4: IgG, Subclass 4: 66 mg/dL (ref 2–96)

## 2020-11-10 ENCOUNTER — Encounter: Payer: Self-pay | Admitting: Gastroenterology

## 2020-11-10 NOTE — Progress Notes (Signed)
Patient on schedule for Chole tube exchange vs removal. Called and spoke with patient on phone, made aware to be here @ 0900.stated understanding.

## 2020-11-12 ENCOUNTER — Other Ambulatory Visit: Payer: Self-pay

## 2020-11-12 ENCOUNTER — Other Ambulatory Visit: Payer: Self-pay | Admitting: Interventional Radiology

## 2020-11-12 ENCOUNTER — Encounter: Payer: Self-pay | Admitting: *Deleted

## 2020-11-12 ENCOUNTER — Ambulatory Visit
Admission: RE | Admit: 2020-11-12 | Discharge: 2020-11-12 | Disposition: A | Discharge status: Home / Self Care | Payer: Self-pay | Source: Ambulatory Visit | Attending: Interventional Radiology | Admitting: Interventional Radiology

## 2020-11-12 DIAGNOSIS — K819 Cholecystitis, unspecified: Secondary | ICD-10-CM

## 2020-11-12 HISTORY — PX: IR RADIOLOGIST EVAL & MGMT: IMG5224

## 2020-11-12 NOTE — Progress Notes (Signed)
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  ERCP was unsuccessful, thus percutaneous biliary drain was necessitated on 08/07/20.  The drain was kept to bag until 09/18/20, at which time the drain was exchanged and capped.  He has followed up with Dr. Allegra Lai (GI) and is currently taping steroids with improving IgG4 levels. He is scheduled for a repeat biliary drain exchange tomorrow (11/13/20) at [facility] and would like to discuss his overall care prior to the procedure, therefore is seen today via telephone virtual visit.  Has been feeling well, no issues with tube, capped.  He has been seen at [facility] who recommended a gradual steroid taper followed by a repeat abdominal MR after taper is complete in several weeks.  Today he has various questions about flushing the drain, clarification of wording from his recent MRI, and overall prognosis.   Past Medical History:  Diagnosis Date  . Autoimmune pancreatitis (HCC)   . Dysthymic disorder    and mood swings controlled on zoloft  . GERD (gastroesophageal reflux disease)   . History of kidney stones   . Hyperlipidemia   . Hypertension   . Renal stone     Past Surgical History:  Procedure Laterality Date  . CHOLECYSTECTOMY  04/28/01   Daphine Deutscher)  . ERCP N/A 08/04/2020   Procedure: ENDOSCOPIC RETROGRADE CHOLANGIOPANCREATOGRAPHY (ERCP);  Surgeon: [name], MD;  Location: [facility] ENDOSCOPY;  Service: Endoscopy;  Laterality: N/A;  . Hepatobiliary scan  04/17/01   +  . IR CHOLANGIOGRAM EXISTING TUBE  09/18/2020  . IR PERC CHOLECYSTOSTOMY  08/07/2020  . IR RADIOLOGIST EVAL & MGMT  09/02/2020  . UPPER GASTROINTESTINAL ENDOSCOPY  ~94   Reflux    Allergies: Amoxicillin,  Clarithromycin, Codeine sulfate, Flomax [tamsulosin], Iodine, and Shellfish allergy  Medications: Prior to Admission medications   Medication Sig Start Date End Date Taking? Authorizing Provider  aspirin EC 81 MG tablet Take 81 mg by mouth daily. Swallow whole.    [provider]  omeprazole (PRILOSEC) 20 MG capsule TAKE 1 CAPSULE BY MOUTH  DAILY 09/28/20   Marguarite Arbour, MD  predniSONE (DELTASONE) 10 MG tablet Take 1 tablet (10 mg total) by mouth daily with breakfast. 10/19/20 11/18/20  Toney Reil, MD  sertraline (ZOLOFT) 100 MG tablet Take 1 tablet (100 mg total) by mouth daily. 09/28/20   Marguarite Arbour, MD  simvastatin (ZOCOR) 40 MG tablet TAKE 1 TABLET BY MOUTH AT  BEDTIME 09/28/20   Marguarite Arbour, MD  Sodium Chloride Flush (NORMAL SALINE FLUSH) 0.9 % SOLN Flush as directed 10/15/20   Irish Lack, MD  Sodium Chloride Flush (NORMAL SALINE FLUSH) 0.9 % SOLN FLUSH WITH SYRINGE AS NEEDED 09/02/20 09/02/21  Oley Balm, MD     Family History  Problem Relation Age of Onset  . Hypertension Mother   . Heart disease Mother        CHF  . Kidney disease Mother        Renal disease  . Cancer Mother        lung cancer  . Fibromyalgia Sister   . COPD Father   . Heart disease Other   . Hypertension Other   . Stroke Other   . Depression Neg Hx   . Alcohol abuse  Neg Hx   . Drug abuse Neg Hx   . Colon cancer Neg Hx   . Prostate cancer Neg Hx     Social History   Socioeconomic History  . Marital status: Married    Spouse name: Not on file  . Number of children: 1  . Years of education: Not on file  . Highest education level: Not on file  Occupational History  . 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 Occupation: [employer] - started in 2009  Tobacco Use  . Smoking status: Never Smoker  . Smokeless tobacco: Never Used  Vaping Use  . Vaping Use: Never used  Substance and Sexual Activity  .  Alcohol use: No  . Drug use: No  . Sexual activity: Not on file  Other Topics Concern  . Not on file  Social History Narrative   From [place]   Married 1983   1 son   Prev with [employer]   Retired as of 2018   Social Determinants of Corporate investment banker Strain: Not on [employer] Insecurity: Not on file  Transportation Needs: Not on file  Physical Activity: Not on file  Stress: Not on file  Social Connections: Not on file     Vital Signs: There were no vitals taken for this visit.  No physical examination in lieu of virtual visit.  Imaging: No results found.  Labs:  CBC: Recent Labs    08/07/20 1046 08/08/20 0544  WBC 13.5* 13.5*  HGB 11.7* 11.1*  HCT 35.1* 32.8*  PLT 337 299    COAGS: Recent Labs    08/07/20 1046  INR 1.3*    BMP: Recent Labs    08/07/20 1046 08/08/20 0544 11/04/20 0848  NA 132* 133* 140  K 4.6 4.5 4.2  CL 98 99 100  CO2 25 28 28   GLUCOSE 207* 129* 85  BUN 21 22 15   CALCIUM 9.3 9.2 9.6  CREATININE 0.82 0.91 1.09  GFRNONAA >60 >60  --     LIVER FUNCTION TESTS: Recent Labs    08/07/20 1046 08/08/20 0544 11/04/20 0848  BILITOT 16.2* 8.7* 0.4  AST 153* 66* 23  ALT 214* 156* 37  ALKPHOS 827* 658* 95  PROT 7.8 6.7 5.9*  ALBUMIN 2.7* 2.4* 4.0    Assessment and Plan:  65 year old male with 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  He has had no issues with capping trial, flushes tube with 5 mL saline daily.    Overall, his autoimmune condition seems to be very well controlled with return to baseline of all LFTs and IgG4 levels.  We discussed obtaining cholangiogram tomorrow to evaluate biliary anatomy and presence of any residual significant strictures or biliary ductal dilation.  If no abnormalities are present, the tube could be removed.  If any  strictures are present, we discussed drain exchange and setting up for serial cholangioplasty.    I will discuss his case with Dr. 08/09/20, who will be at [facility] tomorrow for the cholangiogram/exchange.    Electronically Signed: Lowella Dandy 11/12/2020, 11:48 AM   I spent a total of 40 Minutes in face to face in clinical consultation, greater than 50% of which was counseling/coordinating care for sclerosing cholangitis.

## 2020-11-13 ENCOUNTER — Emergency Department
Admission: EM | Admit: 2020-11-13 | Discharge: 2020-11-13 | Disposition: A | Discharge status: Home / Self Care | Payer: 59 | Attending: Emergency Medicine | Admitting: Emergency Medicine

## 2020-11-13 ENCOUNTER — Emergency Department: Payer: 59

## 2020-11-13 ENCOUNTER — Ambulatory Visit
Admission: RE | Admit: 2020-11-13 | Discharge: 2020-11-13 | Disposition: A | Discharge status: Home / Self Care | Payer: 59 | Source: Ambulatory Visit | Attending: Interventional Radiology | Admitting: Interventional Radiology

## 2020-11-13 ENCOUNTER — Other Ambulatory Visit: Payer: Self-pay

## 2020-11-13 ENCOUNTER — Encounter: Payer: Self-pay | Admitting: Gastroenterology

## 2020-11-13 DIAGNOSIS — K219 Gastro-esophageal reflux disease without esophagitis: Secondary | ICD-10-CM | POA: Insufficient documentation

## 2020-11-13 DIAGNOSIS — R1011 Right upper quadrant pain: Secondary | ICD-10-CM | POA: Diagnosis present

## 2020-11-13 DIAGNOSIS — Z434 Encounter for attention to other artificial openings of digestive tract: Secondary | ICD-10-CM | POA: Insufficient documentation

## 2020-11-13 DIAGNOSIS — K861 Other chronic pancreatitis: Secondary | ICD-10-CM | POA: Insufficient documentation

## 2020-11-13 DIAGNOSIS — Z7982 Long term (current) use of aspirin: Secondary | ICD-10-CM | POA: Diagnosis not present

## 2020-11-13 DIAGNOSIS — K819 Cholecystitis, unspecified: Secondary | ICD-10-CM

## 2020-11-13 DIAGNOSIS — Z79899 Other long term (current) drug therapy: Secondary | ICD-10-CM | POA: Diagnosis not present

## 2020-11-13 DIAGNOSIS — I10 Essential (primary) hypertension: Secondary | ICD-10-CM | POA: Insufficient documentation

## 2020-11-13 HISTORY — PX: IR EXCHANGE BILIARY DRAIN: IMG6046

## 2020-11-13 LAB — COMPREHENSIVE METABOLIC PANEL
ALT: 41 U/L (ref 0–44)
AST: 31 U/L (ref 15–41)
Albumin: 4 g/dL (ref 3.5–5.0)
Alkaline Phosphatase: 77 U/L (ref 38–126)
Anion gap: 9 (ref 5–15)
BUN: 16 mg/dL (ref 8–23)
CO2: 27 mmol/L (ref 22–32)
Calcium: 9.9 mg/dL (ref 8.9–10.3)
Chloride: 102 mmol/L (ref 98–111)
Creatinine, Ser: 1.06 mg/dL (ref 0.61–1.24)
GFR, Estimated: >60 mL/min (ref >60)
Glucose, Bld: 196 mg/dL — ABNORMAL HIGH (ref 70–99)
Potassium: 4.2 mmol/L (ref 3.5–5.1)
Sodium: 138 mmol/L (ref 135–145)
Total Bilirubin: 0.7 mg/dL (ref 0.3–1.2)
Total Protein: 6.9 g/dL (ref 6.5–8.1)

## 2020-11-13 LAB — CBC
HCT: 46.2 % (ref 39.0–52.0)
Hemoglobin: 15.5 g/dL (ref 13.0–17.0)
MCH: 31.5 pg (ref 26.0–34.0)
MCHC: 33.5 g/dL (ref 30.0–36.0)
MCV: 93.9 fL (ref 80.0–100.0)
Platelets: 217 10*3/uL (ref 150–400)
RBC: 4.92 MIL/uL (ref 4.22–5.81)
RDW: 12.6 % (ref 11.5–15.5)
WBC: 23.8 10*3/uL — ABNORMAL HIGH (ref 4.0–10.5)
nRBC: 0 % (ref 0.0–0.2)

## 2020-11-13 LAB — URINALYSIS, ROUTINE W REFLEX MICROSCOPIC
Bilirubin Urine: NEGATIVE
Glucose, UA: 50 mg/dL — AB
Hgb urine dipstick: NEGATIVE
Ketones, ur: NEGATIVE mg/dL
Leukocytes,Ua: NEGATIVE
Nitrite: NEGATIVE
Protein, ur: NEGATIVE mg/dL
Specific Gravity, Urine: 1.024 (ref 1.005–1.030)
pH: 5 (ref 5.0–8.0)

## 2020-11-13 LAB — LIPASE, BLOOD: Lipase: 20 U/L (ref 11–51)

## 2020-11-13 IMAGING — US US ABDOMEN LIMITED
1 series · 15 of 25 positions shown · non-contrast
Comparison: None.

CLINICAL DATA: Right upper quadrant pain.  Prior cholecystectomy.

EXAM:
ULTRASOUND ABDOMEN LIMITED RIGHT UPPER QUADRANT

[Series 1: us abdomen limited ruq · 15 of 34 slices shown]
[im 1/34]
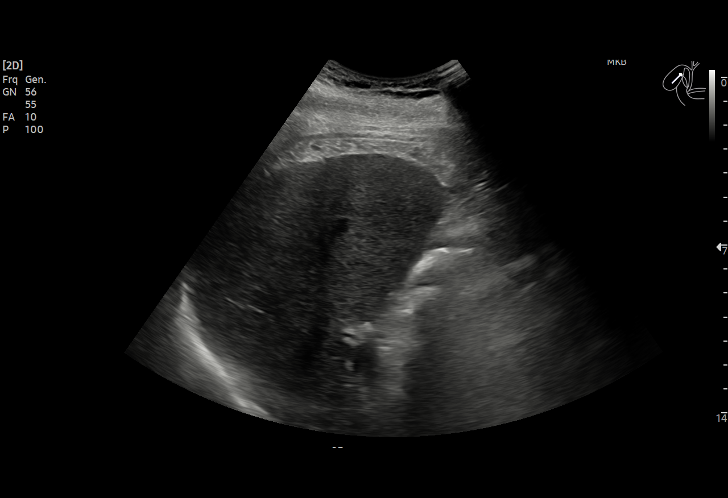
[im 3/34]
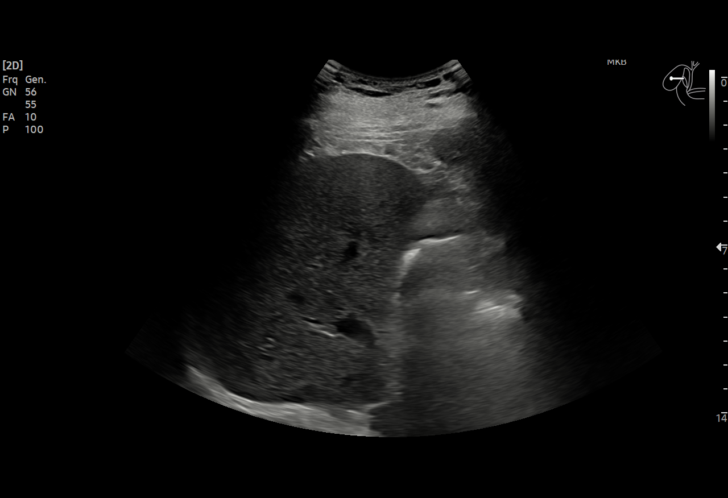
[im 6/34]
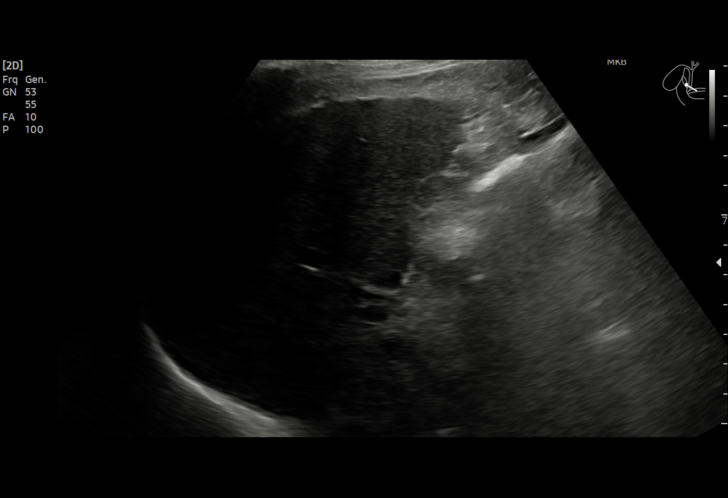
[im 7/34]
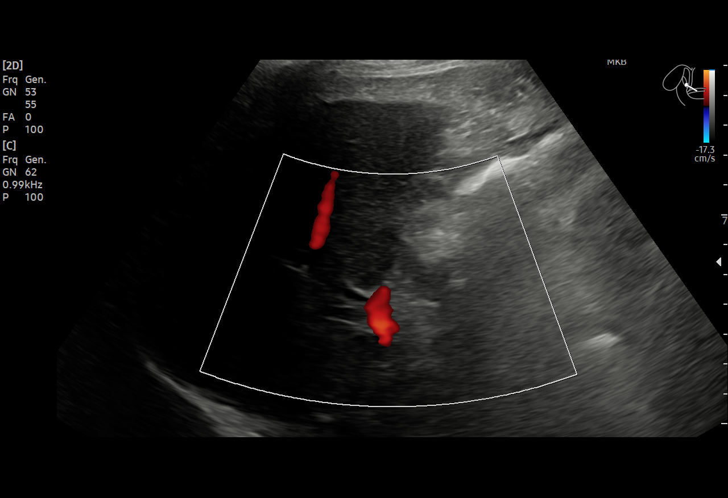
[im 10/34]
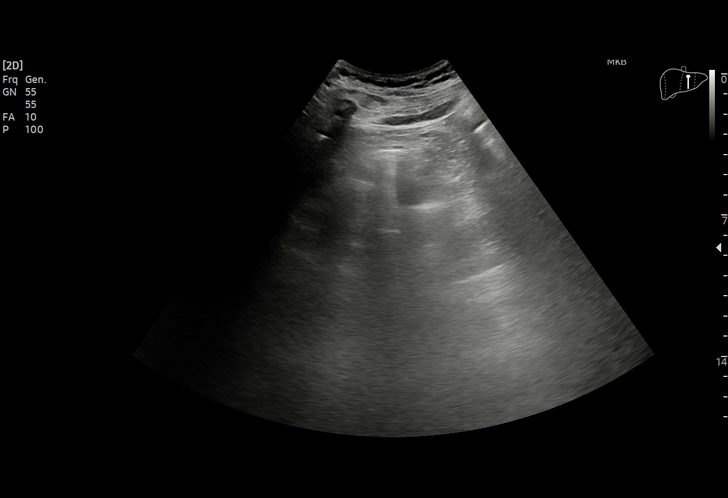
[im 13/34]
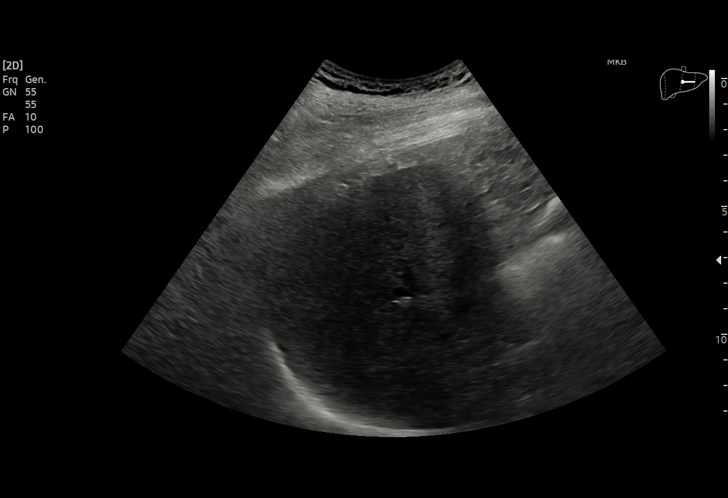
[im 14/34]
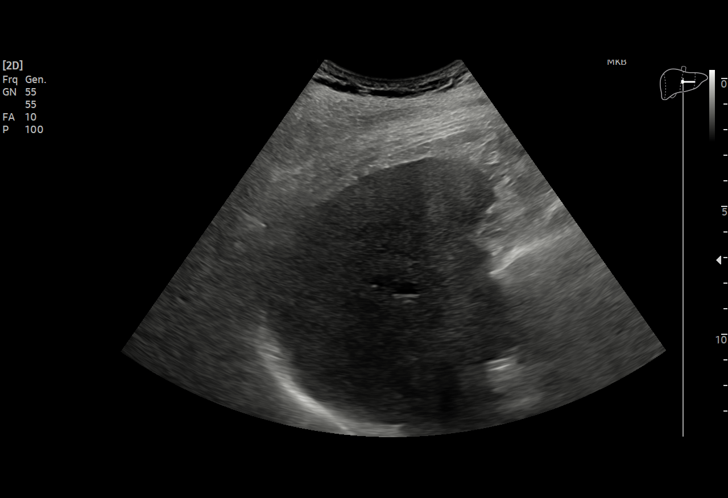
[im 17/34]
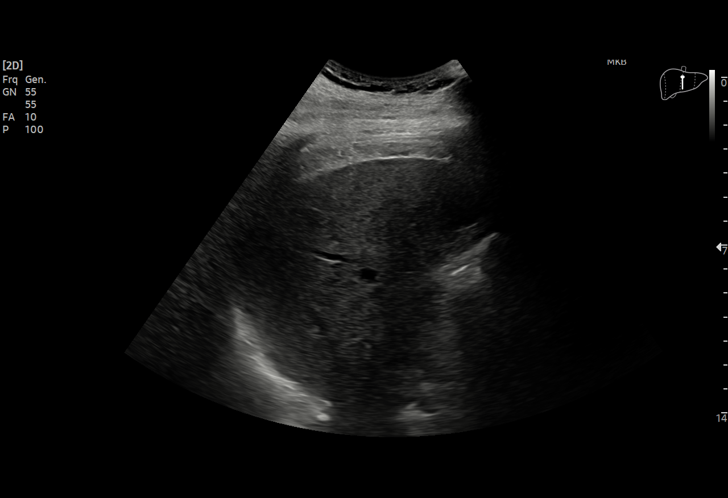
[im 20/34]
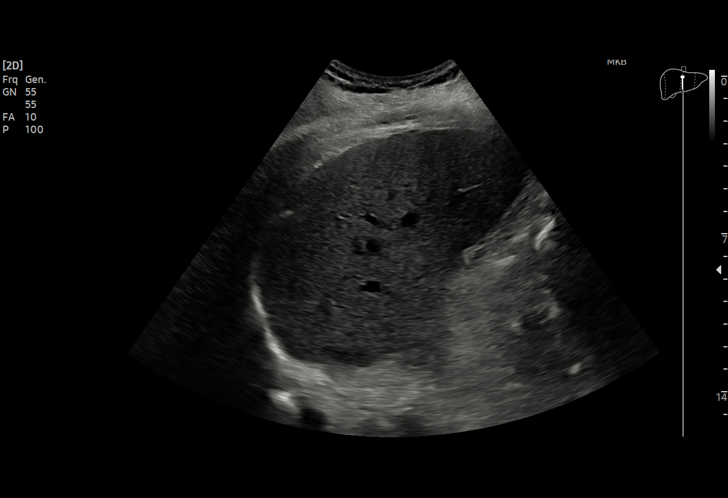
[im 21/34]
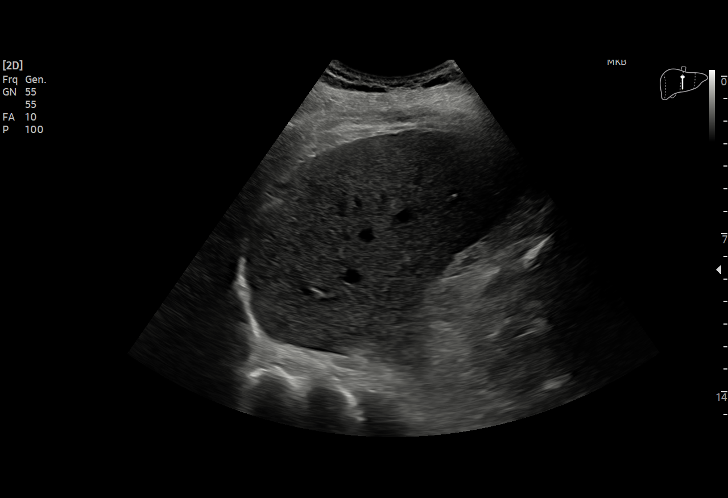
[im 24/34]
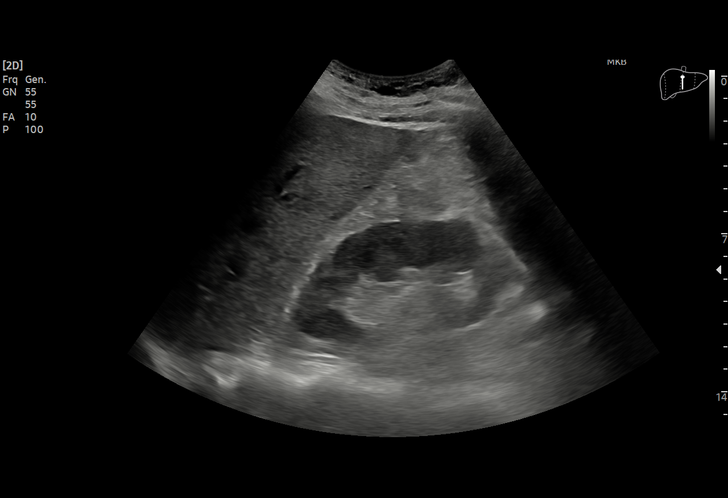
[im 27/34]
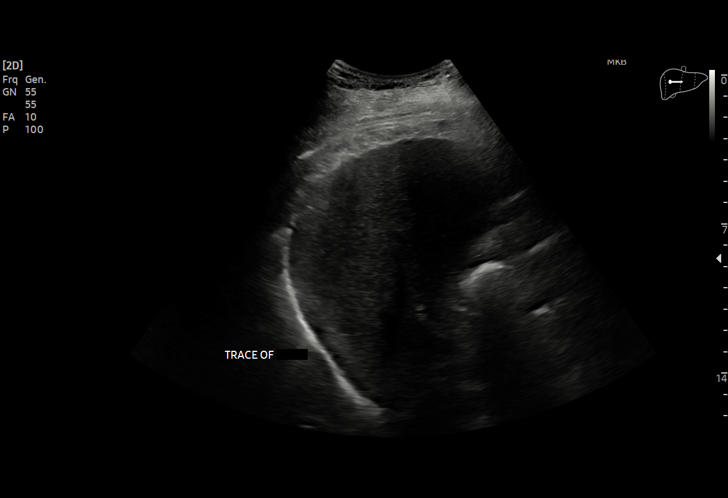
[im 28/34]
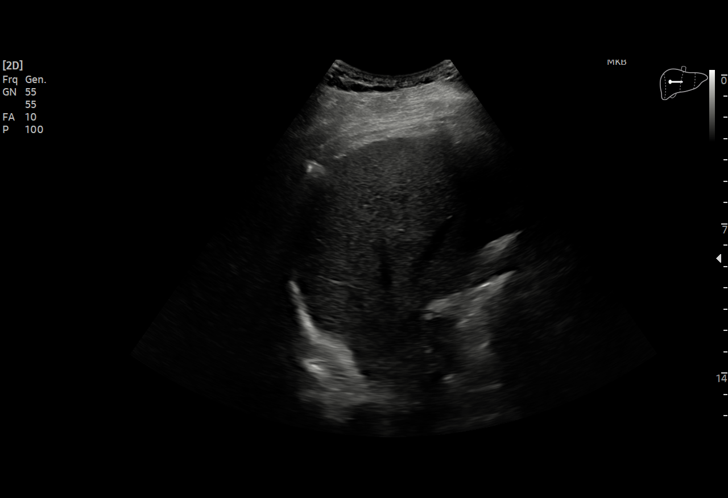
[im 31/34]
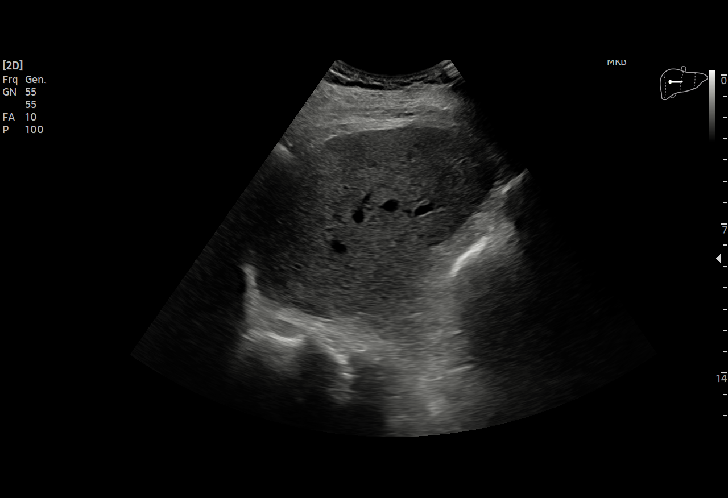
[im 34/34]
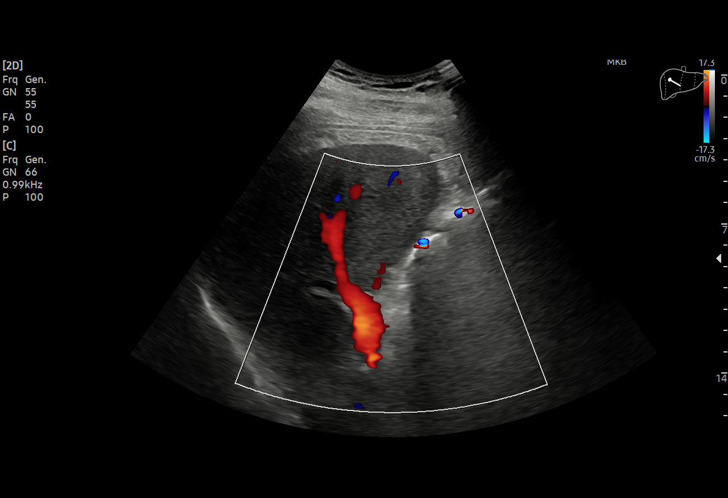

[15 of 25 positions shown; findings below may reference images not displayed]

FINDINGS: Gallbladder:

The gallbladder is surgically absent.

Common bile duct:

Diameter: 4.1 mm

Liver:

No focal lesion identified. Diffusely increased echogenicity of the
liver parenchyma is seen. Portal vein is patent on color Doppler
imaging with normal direction of blood flow towards the liver.

Other: A trace amount of perihepatic fluid is noted.

The study is limited secondary to overlying bowel gas.
IMPRESSION: 1. Evidence of prior cholecystectomy.
2. Fatty liver.
3. Trace amount of perihepatic fluid.

## 2020-11-13 IMAGING — XA IR EXCHANGE BILARY DRAIN
6 series · 9 of 9 positions shown · non-contrast
Comparison: none

INDICATION: 65-year-old with history of biliary obstruction and autoimmune
pancreatitis. An internal/external biliary drain was originally
placed on [DATE]. The catheter was exchanged on [DATE] and
patient has successfully completed a capping trial. Patient presents
for cholangiogram and possible drain removal.

[Series 1: abdomen 4 fps · 1 of 1 slices shown (1 of 5)]
[im 1/1]
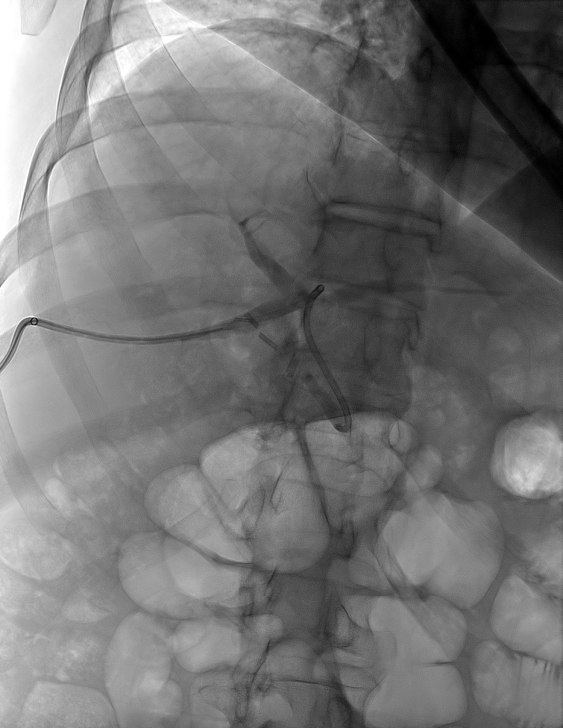

[Series 2: abdomen 4 fps · 1 of 1 slices shown (2 of 5)]
[im 1/1]
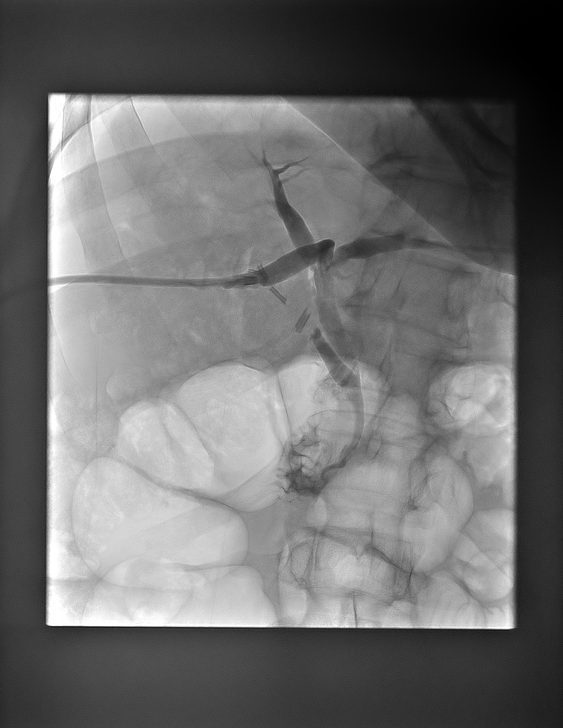

[Series 3: fluoroscopy - stored · 4 of 109 frames shown]
[frame 1/109]
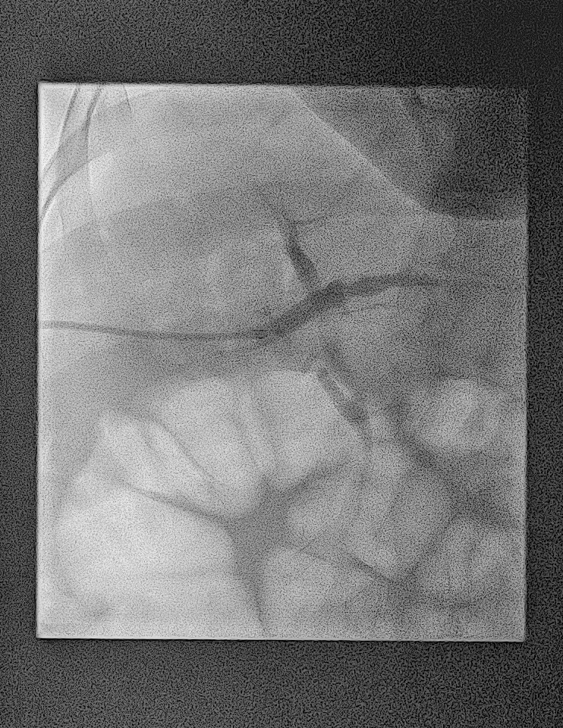
[frame 17/109]
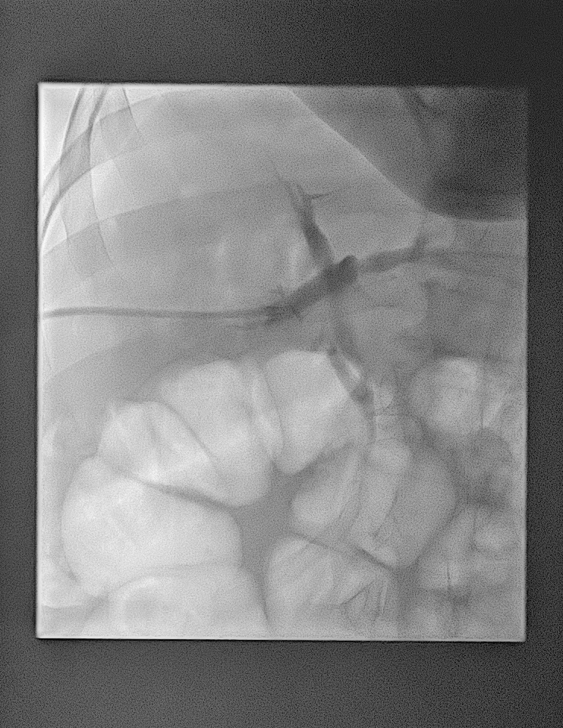
[frame 55/109]
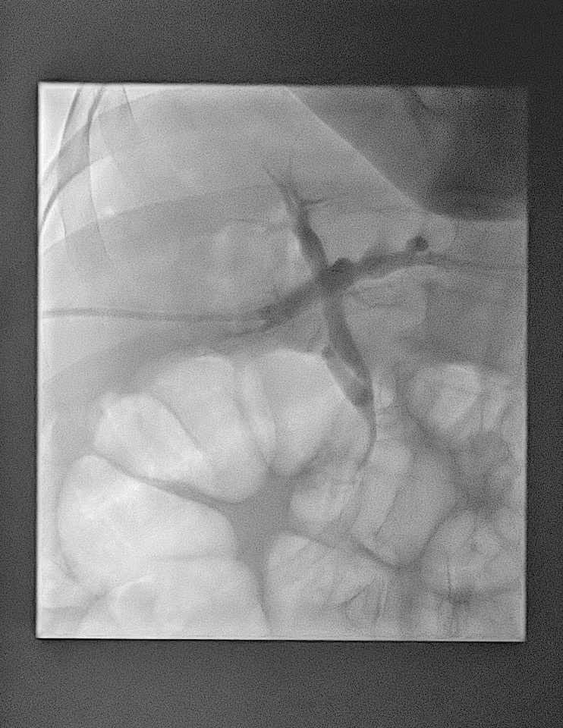
[frame 93/109]
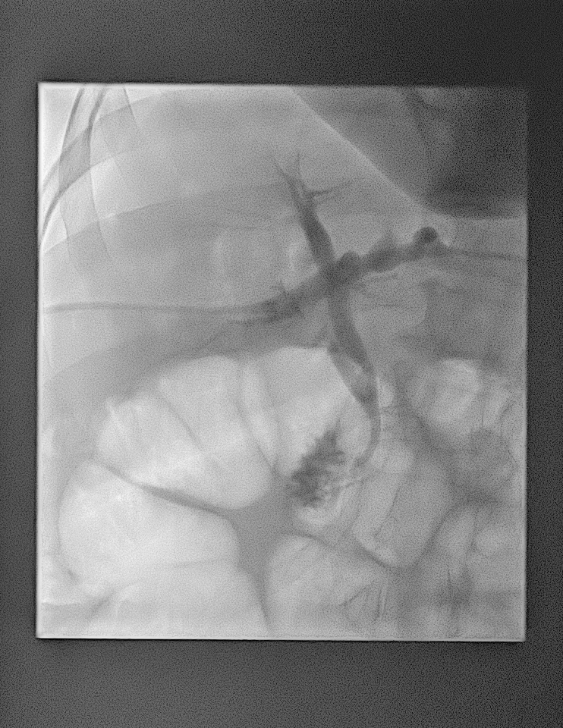

[Series 4: abdomen 4 fps · 1 of 1 slices shown (3 of 5)]
[im 1/1]
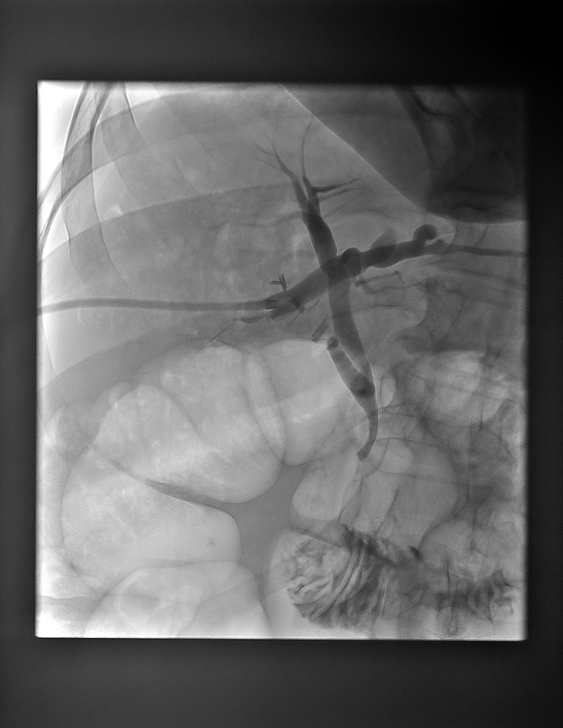

[Series 5: abdomen 4 fps · 1 of 1 slices shown (4 of 5)]
[im 1/1]
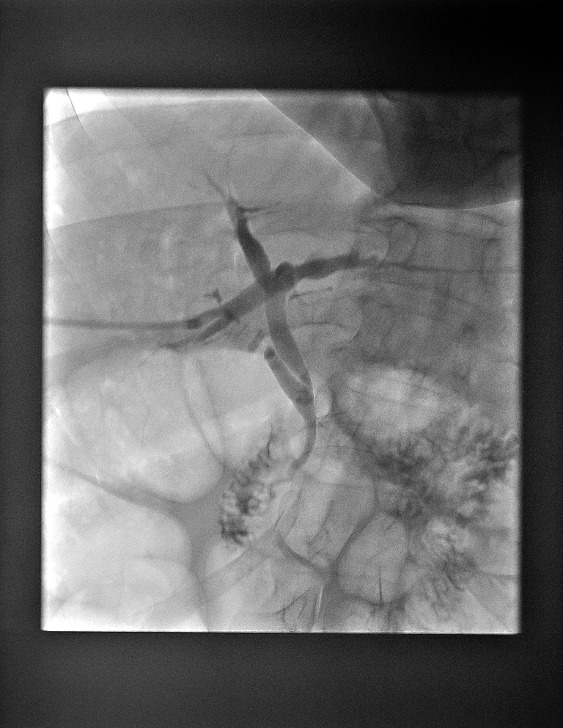

[Series 6: abdomen 4 fps · 1 of 1 slices shown (5 of 5)]
[im 1/1]
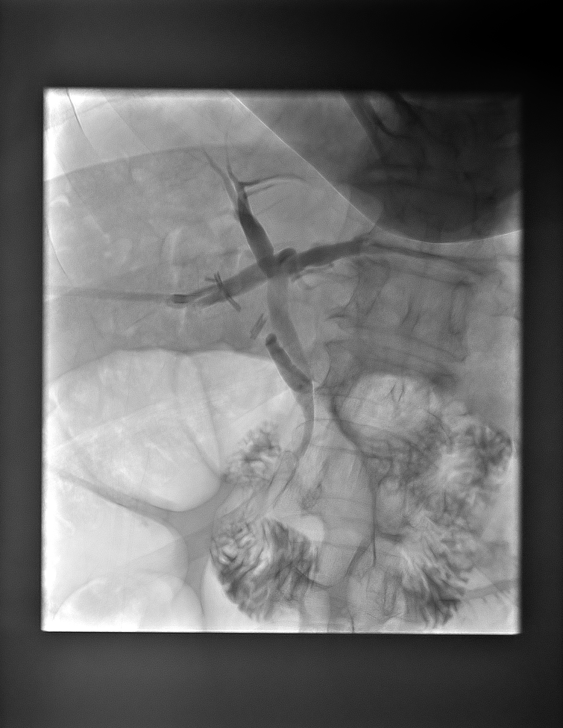

[9 of 9 positions shown; findings below may reference images not displayed]

EXAM:
1. CHOLANGIOGRAM THROUGH EXISTING ACCESS
2. REMOVAL OF BILIARY DRAIN

MEDICATIONS:
None

ANESTHESIA/SEDATION:
None

FLUOROSCOPY TIME:  Fluoroscopy Time: 2 minutes, 18 seconds, 33.3 mGy

COMPLICATIONS:
None immediate.

PROCEDURE:
Informed written consent was obtained from the patient after a
thorough discussion of the procedural risks, benefits and
alternatives. All questions were addressed. Maximal Sterile Barrier
Technique was utilized including caps, mask, sterile gowns, sterile
gloves, sterile drape, hand hygiene and skin antiseptic. A timeout
was performed prior to the initiation of the procedure.

Patient was placed supine on the interventional table. The right
side of the abdomen and drain were prepped and draped in sterile
fashion. Contrast was injected through the biliary drain. The drain
was cut and the retention suture was removed. The catheter was
removed over a Bentson wire. 7 French vascular sheath was advanced
over the wire and a cholangiogram was performed. Catheter was
flushed with a small amount of saline. The sheath and wire were
completely removed. Bandage was placed at the old drain site.
FINDINGS: Drain was slightly retracted. The tip of the catheter appeared to be
within the distal common bile duct. Cholangiogram demonstrates mild
dilatation of the central intrahepatic bile ducts and similar to the
previous examination. Contrast rapidly fills the common bile duct,
cystic duct stump and the duodenum. No large filling defects.
IMPRESSION: 1. Biliary system is widely patent. Contrast rapidly drains into the
common bile duct and duodenum.
2. Biliary drain was completely removed.

## 2020-11-13 MED ORDER — CYCLOBENZAPRINE HCL 5 MG PO TABS: 5.0000 mg | ORAL_TABLET | Freq: Two times a day (BID) | ORAL | 0 refills | Status: AC | PRN

## 2020-11-13 MED ORDER — IBUPROFEN 400 MG PO TABS
400.0000 mg | ORAL_TABLET | Freq: Four times a day (QID) | ORAL | Status: DC | PRN
  Filled 2020-11-13: qty 1

## 2020-11-13 MED ORDER — IODIXANOL 320 MG/ML IV SOLN
25.0000 mL | Freq: Once | INTRAVENOUS | Status: AC
  Administered 2020-11-13: 10 mL

## 2020-11-13 NOTE — ED Notes (Signed)
ED Provider at bedside. Received report from [place], RN

## 2020-11-13 NOTE — Progress Notes (Signed)
Patient ID: Dennis Lambert, male   DOB: Oct 14, 1955, 65 y.o.   MRN: [id number] Patient had a cholangiogram this morning and removal of his biliary drain.  Immediately following the drain removal,  patient complained of mild pain (2 out of 10).  We observed the patient after the procedure and the pain appeared to subside.  Patient went home and ate pizza and now having severe right abdominal pain.  The wife says that he is sweating but he denies chills or fevers.  The pain appears to be intermittent.  Based on today's cholangiogram, I doubt the patient has a biliary obstruction because contrast was rapidly draining into the duodenum.  The cholangiogram required some manipulation and its possible that the patient could be bacteremic.  I have instructed the patient and his wife to come to the emergency department for further evaluation.  Patient will need lab tests and probably pain medications.  Patient may also require imaging if the pain does not subside.

## 2020-11-13 NOTE — ED Provider Notes (Signed)
[facility] Emergency Department Provider Note   ____________________________________________   I have reviewed the triage vital signs and the nursing notes.   HISTORY  Chief Complaint Abdominal Pain   History limited by: Not Limited   HPI Dennis Lambert is a 65 y.o. male who presents to the emergency department today because of concern for abdominal pain. Patient had percutaneous biliary drain stent removed earlier today. He says that shortly after removal he had a small amount of discomfort to that region, however after gettinghome had a more severe episode. States the pain was located to the right upper quadrant, and around the back. It lasted for about 15 minutes. Did feel hot at that time. At the time of my exam the pain had resolved. The patient has not had any fevers.     Records reviewed. 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  Past Medical History:  Diagnosis Date  . Autoimmune pancreatitis (HCC)   . Dysthymic disorder    and mood swings controlled on zoloft  . GERD (gastroesophageal reflux disease)   . History of kidney stones   . Hyperlipidemia   . Hypertension   . Renal stone     Patient Active Problem List   Diagnosis Date Noted  . Autoimmune pancreatitis (HCC) 10/01/2020  . Abnormal LFTs (liver function tests) 08/13/2020  . Obstructive jaundice   . Mixed hyperlipidemia 07/10/2020  . Renal stone   . Advance care planning 01/23/2015  . Routine general medical examination at a health care facility 10/11/2011  . Pure hypercholesterolemia 10/11/2011  . Dysthymic disorder 03/07/2007  . GERD 03/07/2007    Past Surgical History:  Procedure Laterality Date  . CHOLECYSTECTOMY  04/28/01   Daphine Deutscher)  . ERCP N/A 08/04/2020   Procedure: ENDOSCOPIC RETROGRADE CHOLANGIOPANCREATOGRAPHY (ERCP);  Surgeon: [name], MD;  Location: [facility] ENDOSCOPY;  Service: Endoscopy;  Laterality: N/A;   . Hepatobiliary scan  04/17/01   +  . IR CHOLANGIOGRAM EXISTING TUBE  09/18/2020  . IR EXCHANGE BILIARY DRAIN  11/13/2020  . IR PERC CHOLECYSTOSTOMY  08/07/2020  . IR RADIOLOGIST EVAL & MGMT  09/02/2020  . IR RADIOLOGIST EVAL & MGMT  11/12/2020  . UPPER GASTROINTESTINAL ENDOSCOPY  ~94   Reflux    Prior to Admission medications   Medication Sig Start Date End Date Taking? Authorizing Provider  aspirin EC 81 MG tablet Take 81 mg by mouth daily. Swallow whole.    [provider]  omeprazole (PRILOSEC) 20 MG capsule TAKE 1 CAPSULE BY MOUTH  DAILY 09/28/20   Marguarite Arbour, MD  predniSONE (DELTASONE) 10 MG tablet Take 1 tablet (10 mg total) by mouth daily with breakfast. 10/19/20 11/18/20  Toney Reil, MD  sertraline (ZOLOFT) 100 MG tablet Take 1 tablet (100 mg total) by mouth daily. 09/28/20   Marguarite Arbour, MD  simvastatin (ZOCOR) 40 MG tablet TAKE 1 TABLET BY MOUTH AT  BEDTIME 09/28/20   Marguarite Arbour, MD  Sodium Chloride Flush (NORMAL SALINE FLUSH) 0.9 % SOLN Flush as directed 10/15/20   Irish Lack, MD  Sodium Chloride Flush (NORMAL SALINE FLUSH) 0.9 % SOLN FLUSH WITH SYRINGE AS NEEDED 09/02/20 09/02/21  Oley Balm, MD    Allergies Amoxicillin, Clarithromycin, Codeine sulfate, Flomax [tamsulosin], Iodine, and Shellfish allergy  Family History  Problem Relation Age of Onset  . Hypertension Mother   . Heart disease Mother        CHF  . Kidney  disease Mother        Renal disease  . Cancer Mother        lung cancer  . Fibromyalgia Sister   . COPD Father   . Heart disease Other   . Hypertension Other   . Stroke Other   . Depression Neg Hx   . Alcohol abuse Neg Hx   . Drug abuse Neg Hx   . Colon cancer Neg Hx   . Prostate cancer Neg Hx     Social History Social History   Tobacco Use  . Smoking status: Never Smoker  . Smokeless tobacco: Never Used  Vaping Use  . Vaping Use: Never used  Substance Use Topics  . Alcohol use: No  . Drug use: No     Review of Systems Constitutional: No fever/chills Eyes: No visual changes. ENT: No sore throat. Cardiovascular: Denies chest pain. Respiratory: Denies shortness of breath. Gastrointestinal: Positive for RUQ pain.  Genitourinary: Negative for dysuria. Musculoskeletal: Negative for back pain. Skin: Negative for rash. Neurological: Negative for headaches, focal weakness or numbness.  ____________________________________________   PHYSICAL EXAM:  VITAL SIGNS: ED Triage Vitals [11/13/20 1406]  Enc Vitals Group     BP 137/86     Pulse Rate 89     Resp 18     Temp 98.6 F (37 C)     Temp Source Oral     SpO2 94 %     Weight 200 lb (90.7 kg)     Height 5\' 10"  (1.778 m)     Head Circumference      Peak Flow      Pain Score 5   Constitutional: Alert and oriented.  Eyes: Conjunctivae are normal.  ENT      Head: Normocephalic and atraumatic.      Nose: No congestion/rhinnorhea.      Mouth/Throat: Mucous membranes are moist.      Neck: No stridor. Hematological/Lymphatic/Immunilogical: No cervical lymphadenopathy. Cardiovascular: Normal rate, regular rhythm.  No murmurs, rubs, or gallops.  Respiratory: Normal respiratory effort without tachypnea nor retractions. Breath sounds are clear and equal bilaterally. No wheezes/rales/rhonchi. Gastrointestinal: Soft and non tender. No rebound. No guarding.  Genitourinary: Deferred Musculoskeletal: Normal range of motion in all extremities. No lower extremity edema. Neurologic:  Normal speech and language. No gross focal neurologic deficits are appreciated.  Skin:  Skin is warm, dry and intact. No rash noted. Psychiatric: Mood and affect are normal. Speech and behavior are normal. Patient exhibits appropriate insight and judgment.  ____________________________________________    LABS (pertinent positives/negatives)  CBC wbc 23.8, hgb 15.5, plt 217 Lipase 20 CMP wnl except glu 196 UA yellow, clear, glucose  50 ____________________________________________   EKG  None  ____________________________________________    RADIOLOGY  RUQ Trace perihepatic fluid. Fatty liver.   ____________________________________________   PROCEDURES  Procedures  ____________________________________________   INITIAL IMPRESSION / ASSESSMENT AND PLAN / ED COURSE  Pertinent labs & imaging results that were available during my care of the patient were reviewed by me and considered in my medical decision making (see chart for details).   Patient presented to the emergency department today because of concern for abdominal pain. At the time of the exam the patient's pain had resolved. Did have recent percutaneous stent removed. Work up here had leukocytosis which I do think is likely secondary to steroid use. At this time have low suspicion for infection, patient is afebrile. [place] was obtained which just shows some slight perihepatic fluid but no  other concerning findings. At this time given that patient has remained pain free will plan on discharging home. Discussed return precautions.  ____________________________________________   FINAL CLINICAL IMPRESSION(S) / ED DIAGNOSES  Final diagnoses:  RUQ abdominal pain     Note: This dictation was prepared with Dragon dictation. Any transcriptional errors that result from this process are unintentional     Phineas Semen, MD 11/13/20 1947

## 2020-11-13 NOTE — ED Triage Notes (Addendum)
Pt to ER via POV after having biliary drain removed today. Drain was in place for 11 weeks. Reports intense pain and diaphoresis after having drain removed. Drain had been working well and had no other complications associated. Pt reports pain comes and goes, is very intense when happening. Reports pain similar to muscle spasms.  Hx of kidney stones on right side.

## 2020-11-13 NOTE — OR Nursing (Addendum)
PT having pain, nausea, and feeling "hot " in his head and neck area. MD Henn aware a nd has evaluated pt. PT denies and dizziness or throat discomfort.

## 2020-11-13 NOTE — Discharge Instructions (Signed)
Gallbladder Eating Plan If you have a gallbladder condition, you may have trouble digesting fats. Eating a low-fat diet can help reduce your symptoms, and may be helpful before and after having surgery to remove your gallbladder (cholecystectomy). Your health care provider may recommend that you work with a diet and nutrition specialist (dietitian) to help you reduce the amount of fat in your diet. What are tips for following this plan? General guidelines  Limit your fat intake to less than 30% of your total daily calories. If you eat around 1,800 calories each day, this is less than 60 grams (g) of fat per day.  Fat is an important part of a healthy diet. Eating a low-fat diet can make it hard to maintain a healthy body weight. Ask your dietitian how much fat, calories, and other nutrients you need each day.  Eat small, frequent meals throughout the day instead of three large meals.  Drink at least 8-10 cups of fluid a day. Drink enough fluid to keep your urine clear or pale yellow.  Limit alcohol intake to no more than 1 drink a day for nonpregnant women and 2 drinks a day for men. One drink equals 12 oz of beer, 5 oz of wine, or 1 oz of hard liquor. Reading food labels  Check Nutrition Facts on food labels for the amount of fat per serving. Choose foods with less than 3 grams of fat per serving.   Shopping  Choose nonfat and low-fat healthy foods. Look for the words "nonfat," "low fat," or "fat free."  Avoid buying processed or prepackaged foods. Cooking  [place] using low-fat methods, such as baking, broiling, grilling, or boiling.  Fasig with small amounts of healthy fats, such as olive oil, grapeseed oil, canola oil, or sunflower oil. What foods are recommended?  All fresh, frozen, or canned fruits and vegetables.  Whole grains.  Low-fat or non-fat (skim) milk and yogurt.  Lean meat, skinless poultry, fish, eggs, and beans.  Low-fat protein supplement powders or  drinks.  Spices and herbs. What foods are not recommended?  High-fat foods. These include baked goods, fast food, fatty cuts of meat, ice cream, french toast, sweet rolls, pizza, cheese bread, foods covered with butter, creamy sauces, or cheese.  Fried foods. These include french fries, tempura, battered fish, breaded chicken, fried breads, and sweets.  Foods with strong odors.  Foods that cause bloating and gas. Summary  A low-fat diet can be helpful if you have a gallbladder condition, or before and after gallbladder surgery.  Limit your fat intake to less than 30% of your total daily calories. This is about 60 g of fat if you eat 1,800 calories each day.  Eat small, frequent meals throughout the day instead of three large meals. This information is not intended to replace advice given to you by your health care provider. Make sure you discuss any questions you have with your health care provider. Document Revised: 01/16/2020 Document Reviewed: 01/16/2020 Elsevier Patient Education  2021 [employer] Wound Care, Adult Taking care of your wound properly can help to prevent pain, infection, and scarring. It can also help your wound heal more quickly. Follow instructions from your health care provider about how to care for your wound. Supplies needed:  Soap and water.  Wound cleanser.  Gauze.  If needed, a clean bandage (dressing) or other type of wound dressing material to cover or place in the wound. Follow your health care provider's instructions about what dressing supplies to  use.  Cream or ointment to apply to the wound, if told by your health care provider. How to care for your wound Cleaning the wound Ask your health care provider how to clean the wound. This may include:  Using mild soap and water or a wound cleanser.  Using a clean gauze to pat the wound dry after cleaning it. Do not rub or scrub the wound. Dressing care  Wash your hands with soap and water for  at least 20 seconds before and after you change the dressing. If soap and water are not available, use hand sanitizer.  Change your dressing as told by your health care provider. This may include: ? Cleaning or rinsing out (irrigating) the wound. ? Placing a dressing over the wound or in the wound (packing). ? Covering the wound with an outer dressing.  Leave any stitches (sutures), skin glue, or adhesive strips in place. These skin closures may need to stay in place for 2 weeks or longer. If adhesive strip edges start to loosen and curl up, you may trim the loose edges. Do not remove adhesive strips completely unless your health care provider tells you to do that.  Ask your health care provider when you can leave the wound uncovered. Checking for infection Check your wound area every day for signs of infection. Check for:  More redness, swelling, or pain.  Fluid or blood.  Warmth.  Pus or a bad smell.   Follow these instructions at home Medicines  If you were prescribed an antibiotic medicine, cream, or ointment, take or apply it as told by your health care provider. Do not stop using the antibiotic even if your condition improves.  If you were prescribed pain medicine, take it 30 minutes before you do any wound care or as told by your health care provider.  Take over-the-counter and prescription medicines only as told by your health care provider. Eating and drinking  Eat a diet that includes protein, vitamin A, vitamin C, and other nutrient-rich foods to help the wound heal. ? Foods rich in protein include meat, fish, eggs, dairy, beans, and nuts. ? Foods rich in vitamin A include carrots and dark green, leafy vegetables. ? Foods rich in vitamin C include citrus fruits, tomatoes, broccoli, and peppers.  Drink enough fluid to keep your urine pale yellow. General instructions  Do not take baths, swim, use a hot tub, or do anything that would put the wound underwater until your  health care provider approves. Ask your health care provider if you may take showers. You may only be allowed to take sponge baths.  Do not scratch or pick at the wound. Keep it covered as told by your health care provider.  Return to your normal activities as told by your health care provider. Ask your health care provider what activities are safe for you.  Protect your wound from the sun when you are outside for the first 6 months, or for as long as told by your health care provider. Cover up the scar area or apply sunscreen that has an SPF of at least 30.  Do not use any products that contain nicotine or tobacco, such as cigarettes, e-cigarettes, and chewing tobacco. These may delay wound healing. If you need help quitting, ask your health care provider.  Keep all follow-up visits as told by your health care provider. This is important. Contact a health care provider if:  You received a tetanus shot and you have swelling, severe pain, redness,  or bleeding at the injection site.  Your pain is not controlled with medicine.  You have any of these signs of infection: ? More redness, swelling, or pain around the wound. ? Fluid or blood coming from the wound. ? Warmth coming from the wound. ? Pus or a bad smell coming from the wound. ? A fever or chills.  You are nauseous or you vomit.  You are dizzy. Get help right away if:  You have a red streak of skin near the area around your wound.  Your wound has been closed with staples, sutures, skin glue, or adhesive strips and it begins to open up and separate.  Your wound is bleeding, and the bleeding does not stop with gentle pressure.  You have a rash.  You faint.  You have trouble breathing. These symptoms may represent a serious problem that is an emergency. Do not wait to see if the symptoms will go away. Get medical help right away. Call your local emergency services (911 in the [place] Do not drive yourself to the  hospital. Summary  Always wash your hands with soap and water for at least 20 seconds before and after changing your dressing.  Change your dressing as told by your health care provider.  To help with healing, eat foods that are rich in protein, vitamin A, vitamin C, and other nutrients.  Check your wound every day for signs of infection. Contact your health care provider if you suspect that your wound is infected. This information is not intended to replace advice given to you by your health care provider. Make sure you discuss any questions you have with your health care provider. Document Revised: 03/15/2019 Document Reviewed: 03/15/2019 Elsevier Patient Education  2021 [employer].

## 2020-11-13 NOTE — Progress Notes (Signed)
Patient seen in the ED for evaluation of RUQ pain following removal of biliary drain after he returned home. Patient sitting up in bed, wife at the bedside. No discomfort or distress observed. Patient currently denies any pain or discomfort. No pain or tenderness elicited with palpation to the RUQ around drain removal site. Patient stated the pain he felt prior to coming to the ED felt similar to kidney stone pain but the pain is now completely gone.  Case discussed with ED physician - plan for RUQ ultrasound and routine labs. If normal, patient will be discharged with pain medicine/muscle relaxer.   Please call IR with any questions/concerns.  Alwyn Ren, [place] [phone] 11/13/2020, 4:25 PM

## 2020-11-13 NOTE — Discharge Instructions (Addendum)
Please seek medical attention for any high fevers, chest pain, shortness of breath, change in behavior, persistent vomiting, bloody stool or any other new or concerning symptoms.  

## 2020-11-13 NOTE — OR Nursing (Signed)
PT taking PO prednisone and benadryl from home with Dr Manon Hilding orders at bedside.

## 2020-11-13 NOTE — Procedures (Signed)
Interventional Radiology Procedure:   Indications: History of biliary obstruction and autoimmune pancreatitis  Procedure: Cholangiogram from existing tube  Findings: Patent biliary system.  Contrast rapidly drains into duodenum.  Drain was completely removed.  Complications: None     EBL: None  Plan: Follow up with GI.    Achille Xiang R. Lowella Dandy, MD  Pager: [phone]

## 2020-11-17 ENCOUNTER — Telehealth: Payer: Self-pay

## 2020-11-17 ENCOUNTER — Other Ambulatory Visit: Payer: Self-pay | Admitting: Gastroenterology

## 2020-11-17 DIAGNOSIS — K861 Other chronic pancreatitis: Secondary | ICD-10-CM

## 2020-11-17 NOTE — Telephone Encounter (Signed)
Release labs  

## 2020-11-17 NOTE — Telephone Encounter (Signed)
-----   Message from Toney Reil, MD sent at 11/17/2020  1:32 PM EDT ----- Regarding: Labs Please release his labs  RV

## 2020-12-17 ENCOUNTER — Encounter: Payer: Self-pay | Admitting: Gastroenterology
